# Patient Record
Sex: Female | Born: 1935 | Race: Black or African American | Hispanic: No | Marital: Married | Smoking: Never smoker
Health system: Southern US, Community
[De-identification: ages and names within clinical notes are randomized; demographics above are authoritative.]

## PROBLEM LIST (undated history)

## (undated) DIAGNOSIS — F0280 Dementia in other diseases classified elsewhere without behavioral disturbance: Secondary | ICD-10-CM

## (undated) DIAGNOSIS — E870 Hyperosmolality and hypernatremia: Secondary | ICD-10-CM

## (undated) DIAGNOSIS — E119 Type 2 diabetes mellitus without complications: Secondary | ICD-10-CM

## (undated) DIAGNOSIS — I10 Essential (primary) hypertension: Secondary | ICD-10-CM

## (undated) DIAGNOSIS — E559 Vitamin D deficiency, unspecified: Secondary | ICD-10-CM

## (undated) DIAGNOSIS — I639 Cerebral infarction, unspecified: Secondary | ICD-10-CM

## (undated) DIAGNOSIS — G309 Alzheimer's disease, unspecified: Secondary | ICD-10-CM

---

## 1999-12-04 ENCOUNTER — Emergency Department (HOSPITAL_COMMUNITY): Admission: EM | Admit: 1999-12-04 | Discharge: 1999-12-04 | Payer: Self-pay | Admitting: Emergency Medicine

## 2000-08-28 ENCOUNTER — Encounter: Payer: Self-pay | Admitting: Emergency Medicine

## 2000-08-28 ENCOUNTER — Emergency Department (HOSPITAL_COMMUNITY): Admission: EM | Admit: 2000-08-28 | Discharge: 2000-08-28 | Payer: Self-pay | Admitting: Emergency Medicine

## 2001-12-14 ENCOUNTER — Emergency Department (HOSPITAL_COMMUNITY): Admission: EM | Admit: 2001-12-14 | Discharge: 2001-12-14 | Payer: Self-pay | Admitting: Emergency Medicine

## 2001-12-14 ENCOUNTER — Encounter: Payer: Self-pay | Admitting: Emergency Medicine

## 2005-10-14 ENCOUNTER — Emergency Department (HOSPITAL_COMMUNITY): Admission: EM | Admit: 2005-10-14 | Discharge: 2005-10-14 | Payer: Self-pay | Admitting: Emergency Medicine

## 2007-10-17 ENCOUNTER — Encounter: Admission: RE | Admit: 2007-10-17 | Discharge: 2007-10-17 | Payer: Self-pay | Admitting: Neurology

## 2012-02-28 ENCOUNTER — Emergency Department (HOSPITAL_COMMUNITY)
Admission: EM | Admit: 2012-02-28 | Discharge: 2012-02-29 | Disposition: A | Discharge status: Home / Self Care | Payer: Medicare Other | Attending: Emergency Medicine | Admitting: Emergency Medicine

## 2012-02-28 DIAGNOSIS — R1110 Vomiting, unspecified: Secondary | ICD-10-CM

## 2012-02-28 DIAGNOSIS — D72829 Elevated white blood cell count, unspecified: Secondary | ICD-10-CM | POA: Insufficient documentation

## 2012-02-28 DIAGNOSIS — F0280 Dementia in other diseases classified elsewhere without behavioral disturbance: Secondary | ICD-10-CM | POA: Insufficient documentation

## 2012-02-28 DIAGNOSIS — G309 Alzheimer's disease, unspecified: Secondary | ICD-10-CM | POA: Insufficient documentation

## 2012-02-28 DIAGNOSIS — R112 Nausea with vomiting, unspecified: Secondary | ICD-10-CM | POA: Insufficient documentation

## 2012-02-28 DIAGNOSIS — R109 Unspecified abdominal pain: Secondary | ICD-10-CM | POA: Insufficient documentation

## 2012-02-28 LAB — CBC WITH DIFFERENTIAL/PLATELET
Basophils Relative: 0 % (ref 0–1)
Eosinophils Absolute: 0 10*3/uL (ref 0.0–0.7)
HCT: 36.9 % (ref 36.0–46.0)
Hemoglobin: 11.8 g/dL — ABNORMAL LOW (ref 12.0–15.0)
Lymphs Abs: 1.1 10*3/uL (ref 0.7–4.0)
MCH: 26.9 pg (ref 26.0–34.0)
MCHC: 32 g/dL (ref 30.0–36.0)
Monocytes Absolute: 0.5 10*3/uL (ref 0.1–1.0)
Monocytes Relative: 4 % (ref 3–12)
Neutrophils Relative %: 87 % — ABNORMAL HIGH (ref 43–77)
RBC: 4.38 MIL/uL (ref 3.87–5.11)

## 2012-02-28 LAB — COMPREHENSIVE METABOLIC PANEL
Albumin: 4.1 g/dL (ref 3.5–5.2)
Alkaline Phosphatase: 99 U/L (ref 39–117)
BUN: 17 mg/dL (ref 6–23)
Chloride: 102 mEq/L (ref 96–112)
Creatinine, Ser: 0.87 mg/dL (ref 0.50–1.10)
GFR calc Af Amer: 73 mL/min — ABNORMAL LOW (ref >90)
Glucose, Bld: 116 mg/dL — ABNORMAL HIGH (ref 70–99)
Potassium: 4.1 mEq/L (ref 3.5–5.1)
Total Bilirubin: 0.2 mg/dL — ABNORMAL LOW (ref 0.3–1.2)
Total Protein: 9 g/dL — ABNORMAL HIGH (ref 6.0–8.3)

## 2012-02-28 LAB — URINALYSIS, ROUTINE W REFLEX MICROSCOPIC
Bilirubin Urine: NEGATIVE
Leukocytes, UA: NEGATIVE
Nitrite: NEGATIVE
Urobilinogen, UA: 0.2 mg/dL (ref 0.0–1.0)

## 2012-02-28 LAB — LACTIC ACID, PLASMA: Lactic Acid, Venous: 2.1 mmol/L (ref 0.5–2.2)

## 2012-02-28 NOTE — ED Notes (Signed)
Pt is from [facility] and staff stated that pt started vomiting approximately one hour ago,  Pt vomited once en route and was given Zofran 4 mg IV through 20 gauge IV in LAC, pt is alert to person only and per report that is her normal

## 2012-02-28 NOTE — ED Provider Notes (Signed)
History     CSN: [id number]  Arrival date & time 02/28/12  2041   First MD Initiated Contact with Patient 02/28/12 2057      Chief Complaint  Patient presents with  . Nausea  . Emesis    (Consider location/radiation/quality/duration/timing/severity/associated sxs/prior treatment) HPI History provided by EMS.  Level 5 caveat applies because of dementia.  [facility] from [facility].  She started vomiting 1 hour prior to arrival in ED, and vomited once en route.  Was given 4mg  IV zofran and has not vomited since.  She is mentating at baseline; alert to person only.  Pt reports abdominal pain.  Denies CP.   No past medical history on file.  No past surgical history on file.  No family history on file.  History  Substance Use Topics  . Smoking status: Not on file  . Smokeless tobacco: Not on file  . Alcohol Use: Not on file    OB History    No data available      Review of Systems  All other systems reviewed and are negative.    Allergies  Review of patient's allergies indicates not on file.  Home Medications  No current outpatient prescriptions on file.  BP 165/73  Pulse 75  Temp 98.2 F (36.8 C) (Oral)  Resp 19  SpO2 100%  Physical Exam  Nursing note and vitals reviewed. Constitutional: She appears well-developed and well-nourished. No distress.  HENT:  Head: Normocephalic and atraumatic.  Mouth/Throat: Oropharynx is clear and moist.  Eyes:       Normal appearance  Neck: Normal range of motion.  Cardiovascular: Normal rate and regular rhythm.   Pulmonary/Chest: Effort normal and breath sounds normal. No respiratory distress.  Abdominal: Soft. Bowel sounds are normal. She exhibits no distension and no mass. There is no tenderness. There is no rebound and no guarding.  Genitourinary:       No CVA tenderness  Musculoskeletal: Normal range of motion.  Neurological: She is alert.  Skin: Skin is warm and dry. No rash noted.  Psychiatric: She has  a normal mood and affect. Her behavior is normal.    ED Course  Procedures (including critical care time)  Labs Reviewed  CBC WITH DIFFERENTIAL - Abnormal; Notable for the following:    WBC 12.2 (*)     Hemoglobin 11.8 (*)     RDW 16.0 (*)     Neutrophils Relative 87 (*)     Neutro Abs 10.5 (*)     Lymphocytes Relative 9 (*)     All other components within normal limits  COMPREHENSIVE METABOLIC PANEL - Abnormal; Notable for the following:    Glucose, Bld 116 (*)     Total Protein 9.0 (*)     Total Bilirubin 0.2 (*)     GFR calc non Af Amer 63 (*)     GFR calc Af Amer 73 (*)     All other components within normal limits  URINALYSIS, ROUTINE W REFLEX MICROSCOPIC - Abnormal; Notable for the following:    APPearance CLOUDY (*)     Specific Gravity, Urine 1.031 (*)     Ketones, ur TRACE (*)     Protein, ur 100 (*)     All other components within normal limits  CBC - Abnormal; Notable for the following:    WBC 16.3 (*)     Hemoglobin 11.6 (*)     HCT 35.9 (*)     RDW 16.0 (*)  All other components within normal limits  URINE MICROSCOPIC-ADD ON - Abnormal; Notable for the following:    Bacteria, UA MANY (*)     All other components within normal limits  LACTIC ACID, PLASMA  POCT I-STAT TROPONIN I  URINE CULTURE   Dg Chest 1 View  02/29/2012  *RADIOLOGY REPORT*  Clinical Data: Leukocytosis.  Dementia.  CHEST - 1 VIEW  Comparison: None.  Findings: The lungs are well-aerated.  Mild bibasilar opacities likely reflect atelectasis.  Pulmonary vascularity is at the upper limits of normal.  There is no evidence of pleural effusion or pneumothorax.  The cardiomediastinal silhouette is borderline normal in size.  No acute osseous abnormalities are seen.  IMPRESSION: Mild bibasilar opacities likely reflect atelectasis; lungs otherwise grossly clear.   Original Report Authenticated By: Tonia Ghent, M.D.      1. Vomiting       MDM  [phone] F w/ alzheimer's sent from SNF for  vomiting.  ED nursing staff spoke with nursing staff at [facility] and they reported  Blood-streaked emesis, though EMS says that there were only chunks of food in her vomit.  Pt has not vomited since receiving IV zofran en route.  She is afebrile, well-hydrated and abd benign on exam.  Labs unremarkable w/ exception of bacteruria.  Urine sent for culture.  Discussed w/ Dr. Rhunette Croft and he recommends repeating CBC in 4 hours to be sure that hemoglobin is stable.  11:35 PM  Hgb/Hct stable but WBC has increased.  Discussed w/ Dr. Rulon Abide who recommends CXR to r/o pneumonia, particularly because patient is unable to provide a history.  Pending.  Will recheck VS as well.  Pt continues to be alert and in NAD.  3:04 AM   CXR neg.  VSS and pt tolerating pos.  D/c'd back to SNF w/ prescription for zofran.  4:35 AM       Otilio Miu, PA-C 02/29/12 [phone]

## 2012-02-28 NOTE — ED Notes (Signed)
Bed:WA16<BR> Expected date:<BR> Expected time:<BR> Means of arrival:<BR> Comments:<BR>

## 2012-02-28 NOTE — ED Notes (Signed)
Per Florentina Addison PA we will do a recheck of CBC at 0130 then make decision on disposition

## 2012-02-29 ENCOUNTER — Emergency Department (HOSPITAL_COMMUNITY): Payer: Medicare Other

## 2012-02-29 LAB — CBC
HCT: 35.9 % — ABNORMAL LOW (ref 36.0–46.0)
MCH: 27.2 pg (ref 26.0–34.0)
MCHC: 32.3 g/dL (ref 30.0–36.0)
MCV: 84.3 fL (ref 78.0–100.0)
Platelets: 248 10*3/uL (ref 150–400)
RDW: 16 % — ABNORMAL HIGH (ref 11.5–15.5)

## 2012-02-29 MED ORDER — ONDANSETRON 8 MG PO TBDP
8.0000 mg | ORAL_TABLET | Freq: Once | ORAL | Status: AC
  Administered 2012-02-29: 8 mg via ORAL
  Filled 2012-02-29: qty 1

## 2012-02-29 MED ORDER — ONDANSETRON HCL 8 MG PO TABS: 8.0000 mg | ORAL_TABLET | Freq: Three times a day (TID) | ORAL | Status: DC | PRN

## 2012-02-29 MED ORDER — ONDANSETRON HCL 4 MG/2ML IJ SOLN: 4.0000 mg | Freq: Once | INTRAMUSCULAR | Status: DC

## 2012-02-29 NOTE — ED Provider Notes (Signed)
Medical screening examination/treatment/procedure(s) were performed by non-physician practitioner and as supervising physician I was immediately available for consultation/collaboration.  Derwood Kaplan, MD 02/29/12 1534

## 2015-06-26 ENCOUNTER — Emergency Department (HOSPITAL_COMMUNITY)
Admission: EM | Admit: 2015-06-26 | Discharge: 2015-06-26 | Disposition: A | Discharge status: Home / Self Care | Payer: Medicaid Other | Attending: Emergency Medicine | Admitting: Emergency Medicine

## 2015-06-26 ENCOUNTER — Encounter (HOSPITAL_COMMUNITY): Payer: Self-pay | Admitting: Emergency Medicine

## 2015-06-26 DIAGNOSIS — Y999 Unspecified external cause status: Secondary | ICD-10-CM | POA: Insufficient documentation

## 2015-06-26 DIAGNOSIS — Y939 Activity, unspecified: Secondary | ICD-10-CM | POA: Insufficient documentation

## 2015-06-26 DIAGNOSIS — E119 Type 2 diabetes mellitus without complications: Secondary | ICD-10-CM | POA: Diagnosis not present

## 2015-06-26 DIAGNOSIS — W19XXXA Unspecified fall, initial encounter: Secondary | ICD-10-CM | POA: Diagnosis not present

## 2015-06-26 DIAGNOSIS — Z8673 Personal history of transient ischemic attack (TIA), and cerebral infarction without residual deficits: Secondary | ICD-10-CM | POA: Diagnosis not present

## 2015-06-26 DIAGNOSIS — I10 Essential (primary) hypertension: Secondary | ICD-10-CM | POA: Insufficient documentation

## 2015-06-26 DIAGNOSIS — F0390 Unspecified dementia without behavioral disturbance: Secondary | ICD-10-CM | POA: Diagnosis present

## 2015-06-26 DIAGNOSIS — Y929 Unspecified place or not applicable: Secondary | ICD-10-CM | POA: Insufficient documentation

## 2015-06-26 DIAGNOSIS — Z7982 Long term (current) use of aspirin: Secondary | ICD-10-CM | POA: Insufficient documentation

## 2015-06-26 HISTORY — DX: Type 2 diabetes mellitus without complications: E11.9

## 2015-06-26 HISTORY — DX: Alzheimer's disease, unspecified: G30.9

## 2015-06-26 HISTORY — DX: Cerebral infarction, unspecified: I63.9

## 2015-06-26 HISTORY — DX: Dementia in other diseases classified elsewhere, unspecified severity, without behavioral disturbance, psychotic disturbance, mood disturbance, and anxiety: F02.80

## 2015-06-26 HISTORY — DX: Vitamin D deficiency, unspecified: E55.9

## 2015-06-26 HISTORY — DX: Essential (primary) hypertension: I10

## 2015-06-26 HISTORY — DX: Hyperosmolality and hypernatremia: E87.0

## 2015-06-26 NOTE — ED Notes (Signed)
[facility]

## 2015-06-26 NOTE — ED Notes (Signed)
[facility] EMS[facility] and then found in floor. Staff unsure of how patient fell. Hx of dementia. Not on blood thinners. Pt has new bruise to left cheek. Oriented to baseline. Pt has removed towel that EMS placed.

## 2015-06-26 NOTE — ED Notes (Signed)
[facility] Expected date:  Expected time:  Means of arrival:  Comments: 19F/fall

## 2015-06-26 NOTE — ED Provider Notes (Signed)
CSN: [id number]     Arrival date & time 06/26/15  1520 History   First MD Initiated Contact with Patient 06/26/15 1539     Chief Complaint  Patient presents with  . Fall    Level 5 caveat due to dementia. Patient is a 82 y.o. female presenting with fall. The history is provided by the nursing home and the EMS personnel.  Fall  Patient with fall. Patient is demented without complaints. Possible slight bruising to right face. Reportedly at baseline which is demented.    Past Medical History  Diagnosis Date  . Alzheimer's dementia   . Hypertension   . CVA (cerebral infarction)   . Diabetes mellitus without complication (HCC)   . Vitamin D deficiency   . Hyperosmolality   . Hypernatremia    No past surgical history on file. No family history on file. Social History  Substance Use Topics  . Smoking status: None  . Smokeless tobacco: None  . Alcohol Use: None   OB History    No data available     Review of Systems  Unable to perform ROS: Dementia      Allergies  Review of patient's allergies indicates no known allergies.  Home Medications   Prior to Admission medications   Medication Sig Start Date End Date Taking? Authorizing Provider  acetaminophen (TYLENOL) 500 MG tablet Take 500 mg by mouth every 6 (six) hours as needed for mild pain, fever or headache.   Yes Historical Provider, MD  acetaminophen (TYLENOL) 650 MG CR tablet Take 650 mg by mouth 3 (three) times daily as needed for pain.   Yes Historical Provider, MD  alum & mag hydroxide-simeth (MINTOX) 200-200-20 MG/5ML suspension Take 30 mLs by mouth every 6 (six) hours as needed for indigestion or heartburn.   Yes Historical Provider, MD  aspirin 81 MG chewable tablet Chew 81 mg by mouth every morning.   Yes Historical Provider, MD  Calcium Carb-Cholecalciferol (CALCIUM 500 +D) 500-400 MG-UNIT TABS Take 1 tablet by mouth daily.   Yes Historical Provider, MD  ferrous sulfate 220 (44 Fe) MG/5ML solution Take 330  mg by mouth at bedtime.   Yes Historical Provider, MD  guaifenesin (ROBITUSSIN) 100 MG/5ML syrup Take 200 mg by mouth every 6 (six) hours as needed for cough.   Yes Historical Provider, MD  hydrALAZINE (APRESOLINE) 50 MG tablet Take 50 mg by mouth 3 (three) times daily.   Yes Historical Provider, MD  loperamide (IMODIUM) 2 MG capsule Take 2 mg by mouth every 8 (eight) hours as needed for diarrhea or loose stools.   Yes Historical Provider, MD  losartan (COZAAR) 25 MG tablet Take 25 mg by mouth every morning.   Yes Historical Provider, MD  magnesium hydroxide (MILK OF MAGNESIA) 400 MG/5ML suspension Take 30 mLs by mouth daily as needed for mild constipation.   Yes Historical Provider, MD  mirtazapine (REMERON) 15 MG tablet Take 15 mg by mouth at bedtime.   Yes Historical Provider, MD  neomycin-bacitracin-polymyxin (NEOSPORIN) ointment Apply 1 application topically as needed for wound care. apply to eye   Yes Historical Provider, MD  ondansetron (ZOFRAN) 4 MG tablet Take 4 mg by mouth every 6 (six) hours as needed for nausea or vomiting.   Yes Historical Provider, MD   BP   Pulse 85  Temp(Src)   Resp 20  SpO2 98% Physical Exam  Constitutional: She appears well-developed.  HENT:  Head: Atraumatic.  No tenderness to face possible ecchymosis and right zygomatic  area.  Neck: Neck supple.  Cardiovascular: Normal rate.   Pulmonary/Chest: Effort normal.  Abdominal: Soft. There is no tenderness.  Musculoskeletal: Normal range of motion. She exhibits no edema.  Neurological: She is alert.  Pleasant but unable to participate in history. She is without complaints.  Skin: Skin is warm.    ED Course  Procedures (including critical care time) Labs Review Labs Reviewed - No data to display  Imaging Review No results found. I have personally reviewed and evaluated these images and lab results as part of my medical decision-making.   EKG Interpretation None      MDM   Final diagnoses:   Fall, initial encounter    Patient with fall. Possible bruising to face but nontender. Patient is at baseline. Discussed with patient's family. No evidence of injury. Will discharge home.    Benjiman Core, MD 06/26/15 [phone]

## 2015-07-11 ENCOUNTER — Encounter (HOSPITAL_COMMUNITY): Payer: Self-pay | Admitting: *Deleted

## 2015-07-11 ENCOUNTER — Emergency Department (HOSPITAL_COMMUNITY): Payer: Medicare Other

## 2015-07-11 ENCOUNTER — Emergency Department (HOSPITAL_COMMUNITY)
Admission: EM | Admit: 2015-07-11 | Discharge: 2015-07-11 | Disposition: A | Discharge status: Home / Self Care | Payer: Medicare Other | Attending: Emergency Medicine | Admitting: Emergency Medicine

## 2015-07-11 DIAGNOSIS — W07XXXA Fall from chair, initial encounter: Secondary | ICD-10-CM | POA: Diagnosis not present

## 2015-07-11 DIAGNOSIS — E119 Type 2 diabetes mellitus without complications: Secondary | ICD-10-CM | POA: Diagnosis not present

## 2015-07-11 DIAGNOSIS — F0280 Dementia in other diseases classified elsewhere without behavioral disturbance: Secondary | ICD-10-CM | POA: Diagnosis not present

## 2015-07-11 DIAGNOSIS — G309 Alzheimer's disease, unspecified: Secondary | ICD-10-CM | POA: Insufficient documentation

## 2015-07-11 DIAGNOSIS — Y939 Activity, unspecified: Secondary | ICD-10-CM | POA: Diagnosis not present

## 2015-07-11 DIAGNOSIS — Z7982 Long term (current) use of aspirin: Secondary | ICD-10-CM | POA: Diagnosis not present

## 2015-07-11 DIAGNOSIS — S0003XA Contusion of scalp, initial encounter: Secondary | ICD-10-CM | POA: Insufficient documentation

## 2015-07-11 DIAGNOSIS — Y999 Unspecified external cause status: Secondary | ICD-10-CM | POA: Insufficient documentation

## 2015-07-11 DIAGNOSIS — Z8673 Personal history of transient ischemic attack (TIA), and cerebral infarction without residual deficits: Secondary | ICD-10-CM | POA: Diagnosis not present

## 2015-07-11 DIAGNOSIS — S0990XA Unspecified injury of head, initial encounter: Secondary | ICD-10-CM | POA: Diagnosis present

## 2015-07-11 DIAGNOSIS — I10 Essential (primary) hypertension: Secondary | ICD-10-CM | POA: Diagnosis not present

## 2015-07-11 DIAGNOSIS — Y9289 Other specified places as the place of occurrence of the external cause: Secondary | ICD-10-CM | POA: Insufficient documentation

## 2015-07-11 NOTE — ED Notes (Signed)
Patient transported to CT 

## 2015-07-11 NOTE — ED Notes (Signed)
Patient was found by mini lab staff walking the department. Patient was redirected to her room and placed on a bed alarm. Yellow socks were placed on patient and RN notified.

## 2015-07-11 NOTE — ED Notes (Signed)
[facility]AR requested for patient pick up

## 2015-07-11 NOTE — ED Notes (Signed)
Bed: [id number] Expected date:  Expected time:  Means of arrival:  Comments: 84/fall

## 2015-07-11 NOTE — ED Notes (Signed)
[facility] EMS[facility] today, has hx of alzheimer's.  Was sitting in a chair in the activity room, fell asleep and fell face forward.  Hematoma to forehead noted per EMS.  CBG - 122.  Pt is alert per norm.

## 2015-07-11 NOTE — Discharge Instructions (Signed)

## 2015-07-11 NOTE — ED Provider Notes (Signed)
CSN: [id number]     Arrival date & time 07/11/15  1429 History   First MD Initiated Contact with Patient 07/11/15 1439     Chief Complaint  Patient presents with  . Fall    HPI Pt has a history of dementia and is unable to provide any history.  She will answer simple questions with yes and no.  Pt denies any complaints.   She resides at a nursing facility.  She was sleeping in a chair when she fell forward striking her forehead on the ground.   She sustained a large bump on the forehead and was sent to the ED. Past Medical History  Diagnosis Date  . Alzheimer's dementia   . Hypertension   . CVA (cerebral infarction)   . Diabetes mellitus without complication (HCC)   . Vitamin D deficiency   . Hyperosmolality   . Hypernatremia    History reviewed. No pertinent past surgical history. No family history on file. Social History  Substance Use Topics  . Smoking status: Never Smoker   . Smokeless tobacco: None  . Alcohol Use: No   OB History    No data available     Review of Systems    Allergies  Review of patient's allergies indicates no known allergies.  Home Medications   Prior to Admission medications   Medication Sig Start Date End Date Taking? Authorizing Provider  acetaminophen (TYLENOL) 500 MG tablet Take 500 mg by mouth every 6 (six) hours as needed for mild pain, fever or headache.   Yes Historical Provider, MD  acetaminophen (TYLENOL) 650 MG CR tablet Take 650 mg by mouth 3 (three) times daily as needed for pain.   Yes Historical Provider, MD  alum & mag hydroxide-simeth (MINTOX) 200-200-20 MG/5ML suspension Take 30 mLs by mouth every 6 (six) hours as needed for indigestion or heartburn.   Yes Historical Provider, MD  aspirin 81 MG chewable tablet Chew 81 mg by mouth every morning.   Yes Historical Provider, MD  Calcium Carb-Cholecalciferol (CALCIUM 500 +D) 500-400 MG-UNIT TABS Take 1 tablet by mouth daily.   Yes Historical Provider, MD  ferrous sulfate 220 (44 Fe)  MG/5ML solution Take 330 mg by mouth at bedtime.   Yes Historical Provider, MD  guaifenesin (ROBITUSSIN) 100 MG/5ML syrup Take 200 mg by mouth every 6 (six) hours as needed for cough.   Yes Historical Provider, MD  hydrALAZINE (APRESOLINE) 50 MG tablet Take 50 mg by mouth 3 (three) times daily.   Yes Historical Provider, MD  loperamide (IMODIUM) 2 MG capsule Take 2 mg by mouth as needed for diarrhea or loose stools.    Yes Historical Provider, MD  losartan (COZAAR) 25 MG tablet Take 25 mg by mouth every morning.   Yes Historical Provider, MD  magnesium hydroxide (MILK OF MAGNESIA) 400 MG/5ML suspension Take 30 mLs by mouth daily as needed for mild constipation.   Yes Historical Provider, MD  mirtazapine (REMERON) 15 MG tablet Take 15 mg by mouth at bedtime.   Yes Historical Provider, MD  neomycin-bacitracin-polymyxin (NEOSPORIN) ointment Apply 1 application topically as needed for wound care.    Yes Historical Provider, MD  ondansetron (ZOFRAN) 4 MG tablet Take 4 mg by mouth every 6 (six) hours as needed for nausea or vomiting.   Yes Historical Provider, MD   BP 149/68 mmHg  Pulse 61  Temp(Src) 97.8 F (36.6 C) (Oral)  SpO2 100% Physical Exam  Constitutional: No distress.  HENT:  Head: Normocephalic.  Right  Ear: External ear normal.  Left Ear: External ear normal.  Hematoma right forehead, mild ttp  Eyes: Conjunctivae are normal. Right eye exhibits no discharge. Left eye exhibits no discharge. No scleral icterus.  Neck: Neck supple. No tracheal deviation present.  Cardiovascular: Normal rate, regular rhythm and normal heart sounds.   Pulmonary/Chest: Effort normal. No stridor. No respiratory distress. She has no wheezes. She has no rales.  Musculoskeletal: She exhibits no edema.       Right shoulder: Normal.       Left shoulder: Normal.       Right hip: Normal.       Left hip: Normal.       Cervical back: Normal.       Thoracic back: Normal.       Lumbar back: Normal.  Neurological:  She is alert. Cranial nerve deficit: no gross deficits.  Alert and active, taking off her bp cuff and o2 sat monitor.  Folding up items on the bed  Skin: Skin is warm and dry. No rash noted. She is not diaphoretic.  Psychiatric: She has a normal mood and affect.  Nursing note and vitals reviewed.   ED Course  Procedures (including critical care time) Labs Review Labs Reviewed - No data to display  Imaging Review Ct Head Wo Contrast  07/11/2015  CLINICAL DATA:  Fall at nursing home today, was sitting in a chair in the activity room, fell asleep and fell face forward, RIGHT frontal hematoma, history Alzheimer's, hypertension, stroke, diabetes mellitus EXAM: CT HEAD WITHOUT CONTRAST TECHNIQUE: Contiguous axial images were obtained from the base of the skull through the vertex without intravenous contrast. COMPARISON:  None FINDINGS: Scattered streak artifact from motion. Diffuse dilatation of the ventricular system question normal pressure hydrocephalus. Low-attenuation of deep cerebral and periventricular white matter question due to small vessel chronic ischemic changes or transependymal CSF flow. No intracranial hemorrhage, mass lesion, evidence acute infarction, or extra-axial fluid collections. RIGHT frontal scalp hematoma. Calvaria intact. Mucosal thickening of ethmoid air cells and LEFT maxillary sinus. Remaining sinuses and mastoid air cells clear. IMPRESSION: Diffuse ventricular dilatation question normal pressure hydrocephalus. Small vessel chronic ischemic changes of deep cerebral white matter versus transependymal CSF flow. No acute intracranial abnormalities otherwise identified. RIGHT frontal scalp hematoma. Electronically Signed   By: Ulyses Southward M.D.   On: 07/11/2015 15:51   I have personally reviewed and evaluated these images and lab results as part of my medical decision-making.    MDM   Final diagnoses:  Scalp hematoma, initial encounter    Pt has a focal hematoma on her  forehead.  Otherwise appears well.  CT scan without significant injury.  Stable to return to the living facility.    Linwood Dibbles, MD 07/11/15 [phone]

## 2015-07-26 ENCOUNTER — Emergency Department (HOSPITAL_COMMUNITY)
Admission: EM | Admit: 2015-07-26 | Discharge: 2015-07-26 | Disposition: A | Discharge status: Home / Self Care | Payer: Medicare Other | Attending: Emergency Medicine | Admitting: Emergency Medicine

## 2015-07-26 ENCOUNTER — Encounter (HOSPITAL_COMMUNITY): Payer: Self-pay | Admitting: Emergency Medicine

## 2015-07-26 DIAGNOSIS — Y929 Unspecified place or not applicable: Secondary | ICD-10-CM | POA: Diagnosis not present

## 2015-07-26 DIAGNOSIS — W07XXXA Fall from chair, initial encounter: Secondary | ICD-10-CM | POA: Diagnosis not present

## 2015-07-26 DIAGNOSIS — I10 Essential (primary) hypertension: Secondary | ICD-10-CM | POA: Diagnosis not present

## 2015-07-26 DIAGNOSIS — Z043 Encounter for examination and observation following other accident: Secondary | ICD-10-CM | POA: Diagnosis not present

## 2015-07-26 DIAGNOSIS — Z7982 Long term (current) use of aspirin: Secondary | ICD-10-CM | POA: Diagnosis not present

## 2015-07-26 DIAGNOSIS — Y939 Activity, unspecified: Secondary | ICD-10-CM | POA: Diagnosis not present

## 2015-07-26 DIAGNOSIS — Z7984 Long term (current) use of oral hypoglycemic drugs: Secondary | ICD-10-CM | POA: Insufficient documentation

## 2015-07-26 DIAGNOSIS — Z79899 Other long term (current) drug therapy: Secondary | ICD-10-CM | POA: Insufficient documentation

## 2015-07-26 DIAGNOSIS — W19XXXA Unspecified fall, initial encounter: Secondary | ICD-10-CM

## 2015-07-26 DIAGNOSIS — Z8673 Personal history of transient ischemic attack (TIA), and cerebral infarction without residual deficits: Secondary | ICD-10-CM | POA: Insufficient documentation

## 2015-07-26 DIAGNOSIS — Y999 Unspecified external cause status: Secondary | ICD-10-CM | POA: Diagnosis not present

## 2015-07-26 DIAGNOSIS — E119 Type 2 diabetes mellitus without complications: Secondary | ICD-10-CM | POA: Diagnosis not present

## 2015-07-26 NOTE — ED Notes (Signed)
Bed: [facility] Expected date:  Expected time:  Means of arrival:  Comments: 

## 2015-07-26 NOTE — ED Notes (Signed)
Daughter @ [facility].   She stated "[facility] [address].  When she goes to sleep she leans over and falls out of the chair.  They have to send her to the hospital each time.  I don't really see anything different except for some dried blood on her nose.  Pt presents with dried blood to right nare.

## 2015-07-26 NOTE — ED Notes (Signed)
Per EMS-unwitnessed fall-fell forward out of chair-possibly hit head-no LOC-no complaints of pain-patient at baseline mentally

## 2015-07-26 NOTE — ED Provider Notes (Signed)
CSN: [id number]     Arrival date & time 07/26/15  1707 History   First MD Initiated Contact with Patient 07/26/15 1759     Chief Complaint  Patient presents with  . Fall     (Consider location/radiation/quality/duration/timing/severity/associated sxs/prior Treatment) Patient is a 82 y.o. female presenting with fall.  Fall This is a new problem. The current episode started 1 to 2 hours ago. The problem has not changed since onset.Pertinent negatives include no chest pain, no abdominal pain, no headaches and no shortness of breath. Nothing aggravates the symptoms. Nothing relieves the symptoms.    Past Medical History  Diagnosis Date  . Alzheimer's dementia   . Hypertension   . CVA (cerebral infarction)   . Diabetes mellitus without complication (HCC)   . Vitamin D deficiency   . Hyperosmolality   . Hypernatremia    History reviewed. No pertinent past surgical history. No family history on file. Social History  Substance Use Topics  . Smoking status: Never Smoker   . Smokeless tobacco: None  . Alcohol Use: No   OB History    No data available     Review of Systems  Constitutional: Negative for fever.  HENT: Negative for congestion and facial swelling.   Eyes: Negative for discharge and redness.  Respiratory: Negative for cough and shortness of breath.   Cardiovascular: Negative for chest pain.  Gastrointestinal: Negative for abdominal pain and abdominal distention.  Endocrine: Negative for polydipsia.  Genitourinary: Negative for dysuria.  Musculoskeletal: Negative for back pain.  Skin: Negative for wound.  Neurological: Negative for headaches.      Allergies  Review of patient's allergies indicates no known allergies.  Home Medications   Prior to Admission medications   Medication Sig Start Date End Date Taking? Authorizing Provider  acetaminophen (TYLENOL) 500 MG tablet Take 500 mg by mouth every 6 (six) hours as needed for mild pain, fever or headache.    Yes Historical Provider, MD  acetaminophen (TYLENOL) 650 MG CR tablet Take 650 mg by mouth 3 (three) times daily as needed for pain.   Yes Historical Provider, MD  alum & mag hydroxide-simeth (MINTOX) 200-200-20 MG/5ML suspension Take 30 mLs by mouth every 6 (six) hours as needed for indigestion or heartburn.   Yes Historical Provider, MD  aspirin 81 MG chewable tablet Chew 81 mg by mouth every morning.   Yes Historical Provider, MD  Calcium Carb-Cholecalciferol (CALCIUM 500 +D) 500-400 MG-UNIT TABS Take 1 tablet by mouth daily.   Yes Historical Provider, MD  ferrous sulfate 220 (44 Fe) MG/5ML solution Take 330 mg by mouth at bedtime.   Yes Historical Provider, MD  guaifenesin (ROBITUSSIN) 100 MG/5ML syrup Take 200 mg by mouth every 6 (six) hours as needed for cough.   Yes Historical Provider, MD  hydrALAZINE (APRESOLINE) 50 MG tablet Take 50 mg by mouth 3 (three) times daily.   Yes Historical Provider, MD  loperamide (IMODIUM) 2 MG capsule Take 2 mg by mouth as needed for diarrhea or loose stools.    Yes Historical Provider, MD  losartan (COZAAR) 25 MG tablet Take 25 mg by mouth every morning.   Yes Historical Provider, MD  magnesium hydroxide (MILK OF MAGNESIA) 400 MG/5ML suspension Take 30 mLs by mouth daily as needed for mild constipation.   Yes Historical Provider, MD  mirtazapine (REMERON) 15 MG tablet Take 15 mg by mouth at bedtime.   Yes Historical Provider, MD  neomycin-bacitracin-polymyxin (NEOSPORIN) ointment Apply 1 application topically as needed for  wound care.    Yes Historical Provider, MD  ondansetron (ZOFRAN) 4 MG tablet Take 4 mg by mouth every 6 (six) hours as needed for nausea or vomiting.   Yes Historical Provider, MD   BP 125/87 mmHg  Pulse 96  Temp(Src) 98 F (36.7 C) (Oral)  Resp 20  SpO2 100% Physical Exam  Constitutional: She appears well-developed and well-nourished.  HENT:  Head: Normocephalic and atraumatic.  Neck: Normal range of motion.  Cardiovascular:  Normal rate and regular rhythm.   Pulmonary/Chest: No stridor. No respiratory distress.  Abdominal: She exhibits no distension.  Musculoskeletal:  No cervical spine tenderness, thoracic spine tenderness or Lumbar spine tenderness.  No tenderness or pain with palpation and full ROM of all joints in upper and lower extremities.  No ecchymosis or other signs of trauma on back or extremities.  No Pain with AP or lateral compression of ribs.  NO Paracervical ttp, paraspinal ttp   Neurological: She is alert.  Nursing note and vitals reviewed.   ED Course  Procedures (including critical care time) Labs Review Labs Reviewed - No data to display  Imaging Review No results found. I have personally reviewed and evaluated these images and lab results as part of my medical decision-making.   EKG Interpretation None      MDM   Final diagnoses:  None   Unwitnessed fall without any evidence of injury. No evidence of trauma patient is at her baseline no indication for CT scan this time. Plan for discharge with her daughter back to the facility and observation for last couple hours to ensure no change in mental status. New Prescriptions: New Prescriptions   No medications on file     I have personally and contemperaneously reviewed labs and imaging and used in my decision making as above.   A medical screening exam was performed and I feel the patient has had an appropriate workup for their chief complaint at this time and likelihood of emergent condition existing is low and thus workup can continue on an outpatient basis.. Their vital signs are stable. They have been counseled on decision, discharge, follow up and which symptoms necessitate immediate return to the emergency department.  They verbally stated understanding and agreement with plan and discharged in stable condition.    Marily Memos, MD 07/26/15 [phone]

## 2015-08-25 ENCOUNTER — Emergency Department (HOSPITAL_COMMUNITY): Payer: Medicare Other

## 2015-08-25 ENCOUNTER — Encounter (HOSPITAL_COMMUNITY): Payer: Self-pay | Admitting: Emergency Medicine

## 2015-08-25 ENCOUNTER — Emergency Department (HOSPITAL_COMMUNITY)
Admission: EM | Admit: 2015-08-25 | Discharge: 2015-08-25 | Disposition: A | Discharge status: Home / Self Care | Payer: Medicare Other | Attending: Emergency Medicine | Admitting: Emergency Medicine

## 2015-08-25 DIAGNOSIS — I10 Essential (primary) hypertension: Secondary | ICD-10-CM | POA: Insufficient documentation

## 2015-08-25 DIAGNOSIS — Z7982 Long term (current) use of aspirin: Secondary | ICD-10-CM | POA: Insufficient documentation

## 2015-08-25 DIAGNOSIS — Z79899 Other long term (current) drug therapy: Secondary | ICD-10-CM | POA: Insufficient documentation

## 2015-08-25 DIAGNOSIS — G309 Alzheimer's disease, unspecified: Secondary | ICD-10-CM | POA: Diagnosis not present

## 2015-08-25 DIAGNOSIS — R112 Nausea with vomiting, unspecified: Secondary | ICD-10-CM | POA: Diagnosis not present

## 2015-08-25 DIAGNOSIS — Z8673 Personal history of transient ischemic attack (TIA), and cerebral infarction without residual deficits: Secondary | ICD-10-CM | POA: Diagnosis not present

## 2015-08-25 LAB — COMPREHENSIVE METABOLIC PANEL
ALK PHOS: 65 U/L (ref 38–126)
ALT: 22 U/L (ref 14–54)
AST: 25 U/L (ref 15–41)
Albumin: 4.1 g/dL (ref 3.5–5.0)
Anion gap: 7 (ref 5–15)
BILIRUBIN TOTAL: 0.5 mg/dL (ref 0.3–1.2)
BUN: 24 mg/dL — ABNORMAL HIGH (ref 6–20)
CALCIUM: 9.3 mg/dL (ref 8.9–10.3)
CO2: 30 mmol/L (ref 22–32)
CREATININE: 1.13 mg/dL — AB (ref 0.44–1.00)
Chloride: 106 mmol/L (ref 101–111)
GFR, EST AFRICAN AMERICAN: 52 mL/min — AB (ref >60)
GFR, EST NON AFRICAN AMERICAN: 45 mL/min — AB (ref >60)
Glucose, Bld: 106 mg/dL — ABNORMAL HIGH (ref 65–99)
Potassium: 3.4 mmol/L — ABNORMAL LOW (ref 3.5–5.1)
Sodium: 143 mmol/L (ref 135–145)
Total Protein: 7.7 g/dL (ref 6.5–8.1)

## 2015-08-25 LAB — CBC WITH DIFFERENTIAL/PLATELET
BASOS ABS: 0 10*3/uL (ref 0.0–0.1)
BASOS PCT: 0 %
EOS ABS: 0.2 10*3/uL (ref 0.0–0.7)
Eosinophils Relative: 3 %
HEMATOCRIT: 31.6 % — AB (ref 36.0–46.0)
HEMOGLOBIN: 10 g/dL — AB (ref 12.0–15.0)
Lymphocytes Relative: 22 %
Lymphs Abs: 1 10*3/uL (ref 0.7–4.0)
MCH: 27.2 pg (ref 26.0–34.0)
MCHC: 31.6 g/dL (ref 30.0–36.0)
MCV: 86.1 fL (ref 78.0–100.0)
MONOS PCT: 6 %
Monocytes Absolute: 0.3 10*3/uL (ref 0.1–1.0)
NEUTROS ABS: 3.2 10*3/uL (ref 1.7–7.7)
NEUTROS PCT: 69 %
Platelets: 306 10*3/uL (ref 150–400)
RBC: 3.67 MIL/uL — AB (ref 3.87–5.11)
RDW: 15.8 % — ABNORMAL HIGH (ref 11.5–15.5)
WBC: 4.6 10*3/uL (ref 4.0–10.5)

## 2015-08-25 LAB — URINALYSIS, ROUTINE W REFLEX MICROSCOPIC
BILIRUBIN URINE: NEGATIVE
Glucose, UA: NEGATIVE mg/dL
HGB URINE DIPSTICK: NEGATIVE
Ketones, ur: NEGATIVE mg/dL
Leukocytes, UA: NEGATIVE
Nitrite: NEGATIVE
Protein, ur: NEGATIVE mg/dL
SPECIFIC GRAVITY, URINE: 1.015 (ref 1.005–1.030)
pH: 6 (ref 5.0–8.0)

## 2015-08-25 LAB — I-STAT TROPONIN, ED: TROPONIN I, POC: 0 ng/mL (ref 0.00–0.08)

## 2015-08-25 LAB — LIPASE, BLOOD: LIPASE: 31 U/L (ref 11–51)

## 2015-08-25 MED ORDER — LORAZEPAM 2 MG/ML IJ SOLN
0.5000 mg | Freq: Once | INTRAMUSCULAR | Status: AC
  Administered 2015-08-25: 0.5 mg via INTRAVENOUS
  Filled 2015-08-25: qty 1

## 2015-08-25 MED ORDER — ONDANSETRON HCL 4 MG/2ML IJ SOLN
4.0000 mg | Freq: Once | INTRAMUSCULAR | Status: AC
  Administered 2015-08-25: 4 mg via INTRAVENOUS
  Filled 2015-08-25: qty 2

## 2015-08-25 MED ORDER — SODIUM CHLORIDE 0.9 % IV BOLUS (SEPSIS)
1000.0000 mL | Freq: Once | INTRAVENOUS | Status: AC
  Administered 2015-08-25: 1000 mL via INTRAVENOUS

## 2015-08-25 NOTE — ED Notes (Signed)
Attempted to offer patient food-patient sleeping.  MD made aware.

## 2015-08-25 NOTE — ED Provider Notes (Signed)
CSN: [id number]     Arrival date & time 08/25/15  0946 History   First MD Initiated Contact with Patient 08/25/15 [phone]     Chief Complaint  Patient presents with  . Emesis     (Consider location/radiation/quality/duration/timing/severity/associated sxs/prior Treatment) Level V caveat due to dementia HPI 82 year old female with history of Alzheimer's dementia, prior CVA, hypertension and diabetes who presents with vomiting. Presents from [facility] skilled nursing facility. Nursing staff reports that patient had vomiting starting at 9 AM this morning. Patient at baseline is nonverbal, and they state that she is currently at her mental status baseline. She currently denies any pain. Otherwise is not able to provide any history.  Past Medical History  Diagnosis Date  . Alzheimer's dementia   . Hypertension   . CVA (cerebral infarction)   . Diabetes mellitus without complication (HCC)   . Vitamin D deficiency   . Hyperosmolality   . Hypernatremia    History reviewed. No pertinent past surgical history. History reviewed. No pertinent family history. Social History  Substance Use Topics  . Smoking status: Never Smoker   . Smokeless tobacco: None  . Alcohol Use: No   OB History    No data available     Review of Systems  Unable to perform ROS: Dementia      Allergies  Review of patient's allergies indicates no known allergies.  Home Medications   Prior to Admission medications   Medication Sig Start Date End Date Taking? Authorizing Provider  acetaminophen (TYLENOL) 500 MG tablet Take 500 mg by mouth every 6 (six) hours as needed for mild pain, fever or headache.    Historical Provider, MD  acetaminophen (TYLENOL) 650 MG CR tablet Take 650 mg by mouth 3 (three) times daily as needed for pain.    Historical Provider, MD  alum & mag hydroxide-simeth (MINTOX) 200-200-20 MG/5ML suspension Take 30 mLs by mouth every 6 (six) hours as needed for indigestion or heartburn.     Historical Provider, MD  aspirin 81 MG chewable tablet Chew 81 mg by mouth every morning.    Historical Provider, MD  Calcium Carb-Cholecalciferol (CALCIUM 500 +D) 500-400 MG-UNIT TABS Take 1 tablet by mouth daily.    Historical Provider, MD  ferrous sulfate 220 (44 Fe) MG/5ML solution Take 330 mg by mouth at bedtime.    Historical Provider, MD  guaifenesin (ROBITUSSIN) 100 MG/5ML syrup Take 200 mg by mouth every 6 (six) hours as needed for cough.    Historical Provider, MD  hydrALAZINE (APRESOLINE) 50 MG tablet Take 50 mg by mouth 3 (three) times daily.    Historical Provider, MD  loperamide (IMODIUM) 2 MG capsule Take 2 mg by mouth as needed for diarrhea or loose stools.     Historical Provider, MD  losartan (COZAAR) 25 MG tablet Take 25 mg by mouth every morning.    Historical Provider, MD  magnesium hydroxide (MILK OF MAGNESIA) 400 MG/5ML suspension Take 30 mLs by mouth daily as needed for mild constipation.    Historical Provider, MD  mirtazapine (REMERON) 15 MG tablet Take 15 mg by mouth at bedtime.    Historical Provider, MD  neomycin-bacitracin-polymyxin (NEOSPORIN) ointment Apply 1 application topically as needed for wound care.     Historical Provider, MD  ondansetron (ZOFRAN) 4 MG tablet Take 4 mg by mouth every 6 (six) hours as needed for nausea or vomiting.    Historical Provider, MD   BP 155/91 mmHg  Pulse 80  Temp(Src) 97.5 F (36.4  C) (Axillary)  Resp 13  SpO2 97% Physical Exam Physical Exam  Nursing note and vitals reviewed. Constitutional: elderly woman, non-toxic, and in no acute distress Head: Normocephalic and atraumatic.  Mouth/Throat: Mucous membranes moist Neck: Normal range of motion. Neck supple.  Cardiovascular: Normal rate and regular rhythm.   Pulmonary/Chest: Effort normal and breath sounds normal.  Abdominal: Soft. There is no tenderness. There is no rebound and no guarding.  Musculoskeletal: Normal range of motion.  Neurological: Alert, no facial droop,  non-verbal, moves all extremities symmetrically, does not obey commands Skin: Skin is warm and dry.  Psychiatric: Cooperative  ED Course  Procedures (including critical care time) Labs Review Labs Reviewed - No data to display  Imaging Review No results found. I have personally reviewed and evaluated these images and lab results as part of my medical decision-making.   EKG Interpretation None      MDM   Final diagnoses:  None    82 year old female with history of dementia, prior CVA, hypertension who presents with nausea and vomiting prior to arrival. She is at her reported baseline mental status. Grossly neuro intact. Was soft and benign abdomen. Seems like patient was vomiting phlegm prior to arrival, but she is no further emesis here in the emergency department. Given recent falls CT head performed to look for intracranial bleeding, which is visualized. X-ray acute abdominal series without obstructive pattern. With her benign abdominal exam and she does not require any further imaging. Basic blood work overall unremarkable aside from mild AKI which she is given IV fluids. EKG non-ischemic and tropinin negative. Seems less likely to be aytpical ACS presentation. She is observed in the ED and able to tolerate by mouth intake. She continues to be at her baseline. I feel that she is stabilized for discharge back to her facility.    Lavera Guiseana Duo Malley Hauter, MD 08/25/15 1556

## 2015-08-25 NOTE — Discharge Instructions (Signed)
Return for worsening symptoms, including fever, worsening pain, intractable vomiting or any other symptoms concerning to you.  Nausea and Vomiting Nausea is a sick feeling that often comes before throwing up (vomiting). Vomiting is a reflex where stomach contents come out of your mouth. Vomiting can cause severe loss of body fluids (dehydration). Children and elderly adults can become dehydrated quickly, especially if they also have diarrhea. Nausea and vomiting are symptoms of a condition or disease. It is important to find the cause of your symptoms. CAUSES   Direct irritation of the stomach lining. This irritation can result from increased acid production (gastroesophageal reflux disease), infection, food poisoning, taking certain medicines (such as nonsteroidal anti-inflammatory drugs), alcohol use, or tobacco use.  Signals from the brain.These signals could be caused by a headache, heat exposure, an inner ear disturbance, increased pressure in the brain from injury, infection, a tumor, or a concussion, pain, emotional stimulus, or metabolic problems.  An obstruction in the gastrointestinal tract (bowel obstruction).  Illnesses such as diabetes, hepatitis, gallbladder problems, appendicitis, kidney problems, cancer, sepsis, atypical symptoms of a heart attack, or eating disorders.  Medical treatments such as chemotherapy and radiation.  Receiving medicine that makes you sleep (general anesthetic) during surgery. DIAGNOSIS Your caregiver may ask for tests to be done if the problems do not improve after a few days. Tests may also be done if symptoms are severe or if the reason for the nausea and vomiting is not clear. Tests may include:  Urine tests.  Blood tests.  Stool tests.  Cultures (to look for evidence of infection).  X-rays or other imaging studies. Test results can help your caregiver make decisions about treatment or the need for additional tests. TREATMENT You need to  stay well hydrated. Drink frequently but in small amounts.You may wish to drink water, sports drinks, clear broth, or eat frozen ice pops or gelatin dessert to help stay hydrated.When you eat, eating slowly may help prevent nausea.There are also some antinausea medicines that may help prevent nausea. HOME CARE INSTRUCTIONS   Take all medicine as directed by your caregiver.  If you do not have an appetite, do not force yourself to eat. However, you must continue to drink fluids.  If you have an appetite, eat a normal diet unless your caregiver tells you differently.  Eat a variety of complex carbohydrates (rice, wheat, potatoes, bread), lean meats, yogurt, fruits, and vegetables.  Avoid high-fat foods because they are more difficult to digest.  Drink enough water and fluids to keep your urine clear or pale yellow.  If you are dehydrated, ask your caregiver for specific rehydration instructions. Signs of dehydration may include:  Severe thirst.  Dry lips and mouth.  Dizziness.  Dark urine.  Decreasing urine frequency and amount.  Confusion.  Rapid breathing or pulse. SEEK IMMEDIATE MEDICAL CARE IF:   You have blood or brown flecks (like coffee grounds) in your vomit.  You have black or bloody stools.  You have a severe headache or stiff neck.  You are confused.  You have severe abdominal pain.  You have chest pain or trouble breathing.  You do not urinate at least once every 8 hours.  You develop cold or clammy skin.  You continue to vomit for longer than 24 to 48 hours.  You have a fever. MAKE SURE YOU:   Understand these instructions.  Will watch your condition.  Will get help right away if you are not doing well or get worse.  This information is not intended to replace advice given to you by your health care provider. Make sure you discuss any questions you have with your health care provider.   Document Released: 01/21/2005 Document Revised:  04/15/2011 Document Reviewed: 06/20/2010 Elsevier Interactive Patient Education [employer]2016 Elsevier Inc.

## 2015-08-25 NOTE — ED Notes (Signed)
[facility] EMS [facility].  Per staff at facility patient "vomiting a lot phlegm since 0900 this AM".  Staff denies N/V/D, fever.  Patient has history of alzheimers and is currently at baseline and normally nonverbal.

## 2015-08-25 NOTE — ED Notes (Signed)
Bed: [id number] Expected date:  Expected time:  Means of arrival:  Comments: 82 yo emesis

## 2015-08-25 NOTE — ED Notes (Signed)
[facility] called for transport back to facility-states 1.5 hours

## 2015-08-25 NOTE — ED Notes (Signed)
Patient now sitting in bed awake, drinking PO fluids without difficulty.

## 2015-08-25 NOTE — ED Notes (Signed)
MD at bedside. 

## 2015-08-25 NOTE — ED Notes (Addendum)
Per MD continue to monitor patient prior to disposition.  Patient remains asleep but will respond to voice.

## 2015-08-25 NOTE — ED Notes (Signed)
Attempted to call to see how long transportation back to facility would take-states they are unsure at present due to after hours taking call now but that patient is on the list for transportation [facility].

## 2015-08-25 NOTE — ED Notes (Signed)
Bed alarm on due to PT would not stay in bed

## 2015-08-30 ENCOUNTER — Encounter (HOSPITAL_COMMUNITY): Payer: Self-pay | Admitting: Emergency Medicine

## 2015-08-30 ENCOUNTER — Emergency Department (HOSPITAL_COMMUNITY): Payer: Medicare Other

## 2015-08-30 ENCOUNTER — Emergency Department (HOSPITAL_COMMUNITY)
Admission: EM | Admit: 2015-08-30 | Discharge: 2015-08-30 | Disposition: A | Discharge status: Home / Self Care | Payer: Medicare Other | Attending: Emergency Medicine | Admitting: Emergency Medicine

## 2015-08-30 DIAGNOSIS — G309 Alzheimer's disease, unspecified: Secondary | ICD-10-CM | POA: Diagnosis not present

## 2015-08-30 DIAGNOSIS — Z048 Encounter for examination and observation for other specified reasons: Secondary | ICD-10-CM | POA: Diagnosis present

## 2015-08-30 DIAGNOSIS — Y999 Unspecified external cause status: Secondary | ICD-10-CM | POA: Diagnosis not present

## 2015-08-30 DIAGNOSIS — I10 Essential (primary) hypertension: Secondary | ICD-10-CM | POA: Diagnosis not present

## 2015-08-30 DIAGNOSIS — Y939 Activity, unspecified: Secondary | ICD-10-CM | POA: Diagnosis not present

## 2015-08-30 DIAGNOSIS — Y929 Unspecified place or not applicable: Secondary | ICD-10-CM | POA: Diagnosis not present

## 2015-08-30 DIAGNOSIS — W010XXA Fall on same level from slipping, tripping and stumbling without subsequent striking against object, initial encounter: Secondary | ICD-10-CM | POA: Insufficient documentation

## 2015-08-30 DIAGNOSIS — E119 Type 2 diabetes mellitus without complications: Secondary | ICD-10-CM | POA: Insufficient documentation

## 2015-08-30 DIAGNOSIS — Z8673 Personal history of transient ischemic attack (TIA), and cerebral infarction without residual deficits: Secondary | ICD-10-CM | POA: Diagnosis not present

## 2015-08-30 DIAGNOSIS — W19XXXA Unspecified fall, initial encounter: Secondary | ICD-10-CM

## 2015-08-30 NOTE — ED Triage Notes (Signed)
Per EMS pt from [facility] for witnessed trip over walker and fall from standing onto carpet, no head injury, no LOC. Pt at mental baseline with dementia, unable to provide information or describe pain. No anticoagulants

## 2015-08-30 NOTE — ED Notes (Signed)
Pt ambulated in hall with little assist. 

## 2015-08-30 NOTE — ED Notes (Signed)
[facility] called for patient 

## 2015-08-30 NOTE — ED Notes (Signed)
Meal tray ordered for patient as well as an activity belt. She continues to try to get up and threads her legs through the bed rails.

## 2015-08-30 NOTE — ED Provider Notes (Signed)
[facility]-EMERGENCY DEPT Provider Note   CSN: [id number] Arrival date & time: 08/30/15  1035  First Provider Contact:  First MD Initiated Contact with Patient 08/30/15 1136        History   Chief Complaint Chief Complaint  Patient presents with  . Fall    HPI Marilyn Mendez is a 82 y.o. female.  Marilyn Mendez is a 82 y.o. female with hx of prior CVA, DM, HTN, dementia, presents to ED after a fall. Patient's was witnessed by another resident, not witnessed by any staff. Patient apparently was seen tripping over another resident's walker and falling down a carpeted. Not sure if she hit her head. No loss of consciousness. Patient denies any pain. Patient is demented at baseline. According to the staff at the [facility] facility, patient "always wonders around the facility." Patient does not use a walker or cane for ambulation. Hx of falls in the past.       Past Medical History:  Diagnosis Date  . Alzheimer's dementia   . CVA (cerebral infarction)   . Diabetes mellitus without complication (HCC)   . Hypernatremia   . Hyperosmolality   . Hypertension   . Vitamin D deficiency     There are no active problems to display for this patient.   History reviewed. No pertinent surgical history.  OB History    No data available       Home Medications    Prior to Admission medications   Medication Sig Start Date End Date Taking? Authorizing Provider  acetaminophen (TYLENOL) 500 MG tablet Take 500 mg by mouth every 6 (six) hours as needed for mild pain, fever or headache.   Yes Historical Provider, MD  acetaminophen (TYLENOL) 650 MG CR tablet Take 650 mg by mouth 3 (three) times daily as needed for pain.   Yes Historical Provider, MD  alum & mag hydroxide-simeth (MINTOX) 200-200-20 MG/5ML suspension Take 30 mLs by mouth every 6 (six) hours as needed for indigestion or heartburn.   Yes Historical Provider, MD  aspirin 81 MG chewable tablet Chew 81 mg by mouth every  morning.   Yes Historical Provider, MD  Calcium Carb-Cholecalciferol (CALCIUM 500 +D) 500-400 MG-UNIT TABS Take 1 tablet by mouth daily.   Yes Historical Provider, MD  ferrous sulfate 220 (44 Fe) MG/5ML solution Take 330 mg by mouth at bedtime.   Yes Historical Provider, MD  guaifenesin (ROBITUSSIN) 100 MG/5ML syrup Take 200 mg by mouth every 6 (six) hours as needed for cough.   Yes Historical Provider, MD  hydrALAZINE (APRESOLINE) 50 MG tablet Take 50 mg by mouth 3 (three) times daily.   Yes Historical Provider, MD  loperamide (IMODIUM) 2 MG capsule Take 2 mg by mouth as needed for diarrhea or loose stools.    Yes Historical Provider, MD  losartan (COZAAR) 25 MG tablet Take 25 mg by mouth every morning.   Yes Historical Provider, MD  magnesium hydroxide (MILK OF MAGNESIA) 400 MG/5ML suspension Take 30 mLs by mouth daily as needed for mild constipation.   Yes Historical Provider, MD  mirtazapine (REMERON) 15 MG tablet Take 15 mg by mouth at bedtime.   Yes Historical Provider, MD  neomycin-bacitracin-polymyxin (NEOSPORIN) ointment Apply 1 application topically as needed for wound care.    Yes Historical Provider, MD  ondansetron (ZOFRAN) 4 MG tablet Take 4 mg by mouth every 6 (six) hours as needed for nausea or vomiting.   Yes Historical Provider, MD  sodium chloride irrigation 0.9 %  irrigation Irrigate with as directed daily as needed (for wound care- clean area with normal saline, apply neosporin or equivalent, and cover with band aid or gauze/tape).   Yes Historical Provider, MD    Family History History reviewed. No pertinent family history.  Social History Social History  Substance Use Topics  . Smoking status: Never Smoker  . Smokeless tobacco: Not on file  . Alcohol use No     Allergies   Review of patient's allergies indicates no known allergies.   Review of Systems Review of Systems  Unable to perform ROS: Dementia  All other systems reviewed and are negative.    Physical  Exam Updated Vital Signs BP 151/60   Pulse (!) 56   Resp 18   SpO2 100%   Physical Exam  Constitutional: She appears well-developed and well-nourished. No distress.  Eyes: Conjunctivae and EOM are normal. Pupils are equal, round, and reactive to light.  Cardiovascular: Normal rate and regular rhythm.   Murmur heard. Pulmonary/Chest: Effort normal and breath sounds normal. No respiratory distress. She has no wheezes.  Musculoskeletal: Normal range of motion. She exhibits no edema or deformity.  Full ROM of bilateral upper and lower extremities at all joints  Neurological: She is alert.  Answers to her name. Unable to follow directions. Moving all extremities  Skin: Skin is warm. Capillary refill takes less than 2 seconds. No rash noted.  Nursing note and vitals reviewed.    ED Treatments / Results  Labs (all labs ordered are listed, but only abnormal results are displayed) Labs Reviewed - No data to display  EKG  EKG Interpretation None       Radiology No results found.  Procedures Procedures (including critical care time)  Medications Ordered in ED Medications - No data to display   Initial Impression / Assessment and Plan / ED Course  I have reviewed the triage vital signs and the nursing notes.  Pertinent labs & imaging results that were available during my care of the patient were reviewed by me and considered in my medical decision making (see chart for details).  Clinical Course  Comment By Time  Patient seen and examined. I also discussed patient with a nurse, Tammy, at [facility], patient had unwitnessed by staff, but witnessed by other residents fall at the facility today. Patient is demented at baseline. Will get CT head and cervical spine, pelvis x-ray. Will reassess ambulate after if negative. Jaynie Crumble, PA-C 07/26 1200  CTs showing possible hydrocephalus which appears to be chronic finding. Will need further follow up with her doctor. Pt  ambulated in hall. Will dc back to facility Jaynie Crumble, PA-C 07/26 1316    Final Clinical Impressions(s) / ED Diagnoses   Final diagnoses:  Fall, initial encounter    New Prescriptions New Prescriptions   No medications on file     Jaynie Crumble, PA-C 08/30/15 2115    Alvira Monday, MD 09/01/15 [phone]

## 2015-08-30 NOTE — Discharge Instructions (Signed)
CT showing persistent hydrocephalus. Follow up with family doctor regarding this. Fall prevention. Return as needed.

## 2015-09-03 ENCOUNTER — Emergency Department (HOSPITAL_COMMUNITY)
Admission: EM | Admit: 2015-09-03 | Discharge: 2015-09-03 | Disposition: A | Discharge status: Home / Self Care | Payer: Medicare Other | Attending: Emergency Medicine | Admitting: Emergency Medicine

## 2015-09-03 ENCOUNTER — Encounter (HOSPITAL_COMMUNITY): Payer: Self-pay

## 2015-09-03 DIAGNOSIS — W19XXXA Unspecified fall, initial encounter: Secondary | ICD-10-CM | POA: Diagnosis not present

## 2015-09-03 DIAGNOSIS — I10 Essential (primary) hypertension: Secondary | ICD-10-CM | POA: Diagnosis not present

## 2015-09-03 DIAGNOSIS — Z7982 Long term (current) use of aspirin: Secondary | ICD-10-CM | POA: Diagnosis not present

## 2015-09-03 DIAGNOSIS — E119 Type 2 diabetes mellitus without complications: Secondary | ICD-10-CM | POA: Insufficient documentation

## 2015-09-03 DIAGNOSIS — Y999 Unspecified external cause status: Secondary | ICD-10-CM | POA: Insufficient documentation

## 2015-09-03 DIAGNOSIS — F0390 Unspecified dementia without behavioral disturbance: Secondary | ICD-10-CM | POA: Insufficient documentation

## 2015-09-03 DIAGNOSIS — Y939 Activity, unspecified: Secondary | ICD-10-CM | POA: Diagnosis not present

## 2015-09-03 DIAGNOSIS — Z79899 Other long term (current) drug therapy: Secondary | ICD-10-CM | POA: Insufficient documentation

## 2015-09-03 DIAGNOSIS — Y92233 Cafeteria of hospital as the place of occurrence of the external cause: Secondary | ICD-10-CM | POA: Diagnosis not present

## 2015-09-03 LAB — COMPREHENSIVE METABOLIC PANEL
ALK PHOS: 68 U/L (ref 38–126)
ALT: 16 U/L (ref 14–54)
ANION GAP: 7 (ref 5–15)
AST: 22 U/L (ref 15–41)
Albumin: 4.2 g/dL (ref 3.5–5.0)
BILIRUBIN TOTAL: 0.5 mg/dL (ref 0.3–1.2)
BUN: 32 mg/dL — ABNORMAL HIGH (ref 6–20)
CALCIUM: 9.6 mg/dL (ref 8.9–10.3)
CO2: 27 mmol/L (ref 22–32)
Chloride: 106 mmol/L (ref 101–111)
Creatinine, Ser: 1.02 mg/dL — ABNORMAL HIGH (ref 0.44–1.00)
GFR, EST AFRICAN AMERICAN: 59 mL/min — AB (ref >60)
GFR, EST NON AFRICAN AMERICAN: 51 mL/min — AB (ref >60)
GLUCOSE: 101 mg/dL — AB (ref 65–99)
POTASSIUM: 4.3 mmol/L (ref 3.5–5.1)
Sodium: 140 mmol/L (ref 135–145)
TOTAL PROTEIN: 7.6 g/dL (ref 6.5–8.1)

## 2015-09-03 LAB — CBC WITH DIFFERENTIAL/PLATELET
BASOS ABS: 0 10*3/uL (ref 0.0–0.1)
BASOS PCT: 0 %
Eosinophils Absolute: 0.2 10*3/uL (ref 0.0–0.7)
Eosinophils Relative: 2 %
HEMATOCRIT: 31.6 % — AB (ref 36.0–46.0)
Hemoglobin: 9.9 g/dL — ABNORMAL LOW (ref 12.0–15.0)
Lymphocytes Relative: 11 %
Lymphs Abs: 1 10*3/uL (ref 0.7–4.0)
MCH: 26.9 pg (ref 26.0–34.0)
MCHC: 31.3 g/dL (ref 30.0–36.0)
MCV: 85.9 fL (ref 78.0–100.0)
MONO ABS: 0.4 10*3/uL (ref 0.1–1.0)
Monocytes Relative: 5 %
NEUTROS ABS: 7.2 10*3/uL (ref 1.7–7.7)
NEUTROS PCT: 82 %
Platelets: 298 10*3/uL (ref 150–400)
RBC: 3.68 MIL/uL — AB (ref 3.87–5.11)
RDW: 16.2 % — AB (ref 11.5–15.5)
WBC: 8.8 10*3/uL (ref 4.0–10.5)

## 2015-09-03 NOTE — ED Notes (Signed)
I performed a in and out on patient however she did not have anything in her bladder.  Patient just had a incontinent .  I made the nurse aware

## 2015-09-03 NOTE — ED Notes (Addendum)
Pt continues to be sleeping in bed.  No acute distress noted. Family has left. [facility] called for report.

## 2015-09-03 NOTE — ED Triage Notes (Signed)
She comes to [place] from [facility] having been witnessed by staff there to have fallen (not passed out) while standing in their cafeteria. Pt. Is awake, drowsy and confused at what [facility] staff state is her baseline. The paramedic told me pt. C/o the ride here in the ambulance "was too bumpy".

## 2015-09-03 NOTE — ED Notes (Signed)
[facility] on site to take pt back to facility.  Paper scrubs pants placed on pt.  NAD noted on pt.

## 2015-09-03 NOTE — ED Notes (Signed)
Bed: HO12 Expected date:  Expected time:  Means of arrival:  Comments: 82 yo fall, no injury

## 2015-09-03 NOTE — ED Notes (Signed)
[facility] called for transport.  

## 2015-09-03 NOTE — ED Provider Notes (Signed)
[facility]-EMER[facility] DEPT Provider Note   CSN: [id number] Arrival date & time: 09/03/15  1656  History   Chief Complaint Chief Complaint  Patient presents with  . Fall    HPI Marilyn Mendez is a 82 y.o. female.  HPI  Patient presents from nursing facility after a fall. Patient has a history of dementia, level V caveat. Per report the patient had a fall, unwitnessed, but has been otherwise in her usual state of health, including atypical behavior, interactivity, which is essentially nonverbal, though she is ambulatory.   Past Medical History:  Diagnosis Date  . Alzheimer's dementia   . CVA (cerebral infarction)   . Diabetes mellitus without complication (HCC)   . Hypernatremia   . Hyperosmolality   . Hypertension   . Vitamin D deficiency     There are no active problems to display for this patient.   No past surgical history on file.  OB History    No data available       Home Medications    Prior to Admission medications   Medication Sig Start Date End Date Taking? Authorizing Provider  acetaminophen (TYLENOL) 500 MG tablet Take 500 mg by mouth every 4 (four) hours as needed for mild pain, fever or headache.    Yes Historical Provider, MD  acetaminophen (TYLENOL) 650 MG CR tablet Take 650 mg by mouth 3 (three) times daily as needed for pain.   Yes Historical Provider, MD  alum & mag hydroxide-simeth (MINTOX) 200-200-20 MG/5ML suspension Take 30 mLs by mouth every 6 (six) hours as needed for indigestion or heartburn.   Yes Historical Provider, MD  aspirin 81 MG chewable tablet Chew 81 mg by mouth every morning.   Yes Historical Provider, MD  Calcium Carb-Cholecalciferol (CALCIUM 500 +D) 500-400 MG-UNIT TABS Take 1 tablet by mouth daily.   Yes Historical Provider, MD  ferrous sulfate 220 (44 Fe) MG/5ML solution Take 330 mg by mouth at bedtime.   Yes Historical Provider, MD  guaifenesin (ROBITUSSIN) 100 MG/5ML syrup Take 200 mg by mouth every 6 (six) hours as needed  for cough.   Yes Historical Provider, MD  hydrALAZINE (APRESOLINE) 50 MG tablet Take 50 mg by mouth 3 (three) times daily.   Yes Historical Provider, MD  loperamide (IMODIUM) 2 MG capsule Take 2 mg by mouth as needed for diarrhea or loose stools.    Yes Historical Provider, MD  losartan (COZAAR) 25 MG tablet Take 25 mg by mouth every morning.   Yes Historical Provider, MD  magnesium hydroxide (MILK OF MAGNESIA) 400 MG/5ML suspension Take 30 mLs by mouth daily as needed for mild constipation.   Yes Historical Provider, MD  mirtazapine (REMERON) 15 MG tablet Take 15 mg by mouth at bedtime.   Yes Historical Provider, MD  neomycin-bacitracin-polymyxin (NEOSPORIN) ointment Apply 1 application topically as needed for wound care.    Yes Historical Provider, MD  ondansetron (ZOFRAN) 4 MG tablet Take 4 mg by mouth every 6 (six) hours as needed for nausea or vomiting.   Yes Historical Provider, MD  sodium chloride irrigation 0.9 % irrigation Irrigate with as directed daily as needed (for wound care- clean area with normal saline, apply neosporin or equivalent, and cover with band aid or gauze/tape).    Historical Provider, MD    Family History No family history on file.  Social History Social History  Substance Use Topics  . Smoking status: Never Smoker  . Smokeless tobacco: Not on file  . Alcohol use No  Allergies   Review of patient's allergies indicates no known allergies.   Review of Systems Review of Systems  Unable to perform ROS: Dementia     Physical Exam Updated Vital Signs BP 122/73 (BP Location: Right Arm)   Pulse 79   Temp 98.1 F (36.7 C) (Rectal)   Resp 16   SpO2 100%   Physical Exam  Constitutional: No distress.  Cachectic elderly female curled up in bed, nonverbal, but in no distress  HENT:  Head: Normocephalic and atraumatic.  Eyes: Conjunctivae and EOM are normal.  Cardiovascular: Normal rate and regular rhythm.   Pulmonary/Chest: Effort normal and breath  sounds normal. No stridor. No respiratory distress.  Abdominal: She exhibits no distension.  Musculoskeletal: She exhibits no edema.  Neurological: She is alert. No cranial nerve deficit.  Skin: Skin is warm and dry.  Psychiatric: She is withdrawn. Cognition and memory are impaired. She is noncommunicative.  Nursing note and vitals reviewed.    ED Treatments / Results  Labs (all labs ordered are listed, but only abnormal results are displayed) Labs Reviewed  COMPREHENSIVE METABOLIC PANEL - Abnormal; Notable for the following:       Result Value   Glucose, Bld 101 (*)    BUN 32 (*)    Creatinine, Ser 1.02 (*)    GFR calc non Af Amer 51 (*)    GFR calc Af Amer 59 (*)    All other components within normal limits  CBC WITH DIFFERENTIAL/PLATELET - Abnormal; Notable for the following:    RBC 3.68 (*)    Hemoglobin 9.9 (*)    HCT 31.6 (*)    RDW 16.2 (*)    All other components within normal limits  URINALYSIS, ROUTINE W REFLEX MICROSCOPIC (NOT AT [facility])    EKG  EKG Interpretation None       Radiology No results found.  Procedures Procedures (including critical care time)  Medications Ordered in ED Medications - No data to display   Initial Impression / Assessment and Plan / ED Course  I have reviewed the triage vital signs and the nursing notes.  Pertinent labs & imaging results that were available during my care of the patient were reviewed by me and considered in my medical decision making (see chart for details).  Clinical Course  Patient's family has arrived, they state patient is interacting in a typical manner.  Alignment of the patient adequately walks throughout the day and a new nursing facility, and typically when she is tired she just sits or falls down.   On repeat exam the patient is in similar condition, all results discussed with the family.   Final Clinical Impressions(s) / ED Diagnoses   Final diagnoses:  Fall, initial encounter   Elderly  female with dementia, frequent falls presents after a fall. Here she is withdrawn, nonverbal, and according to family interacting in a typical manner. No evidence for trauma, patient does not seem to be any his comfort, has unremarkable vital signs, labs are consistent with prior studies, and she was discharged in stable condition.   Gerhard Munch, MD 09/03/15 2010

## 2015-09-03 NOTE — Discharge Instructions (Signed)
As discussed, your evaluation today has been largely reassuring.  But, it is important that you monitor your condition carefully, and do not hesitate to return to the ED if you develop new, or concerning changes in your condition. ? ?Otherwise, please follow-up with your physician for appropriate ongoing care. ? ?

## 2015-09-09 ENCOUNTER — Encounter (HOSPITAL_COMMUNITY): Payer: Self-pay

## 2015-09-09 ENCOUNTER — Emergency Department (HOSPITAL_COMMUNITY)
Admission: EM | Admit: 2015-09-09 | Discharge: 2015-09-09 | Disposition: A | Discharge status: Home / Self Care | Payer: Medicare Other | Attending: Emergency Medicine | Admitting: Emergency Medicine

## 2015-09-09 DIAGNOSIS — I10 Essential (primary) hypertension: Secondary | ICD-10-CM | POA: Insufficient documentation

## 2015-09-09 DIAGNOSIS — G308 Other Alzheimer's disease: Secondary | ICD-10-CM | POA: Insufficient documentation

## 2015-09-09 DIAGNOSIS — Y939 Activity, unspecified: Secondary | ICD-10-CM | POA: Diagnosis not present

## 2015-09-09 DIAGNOSIS — E119 Type 2 diabetes mellitus without complications: Secondary | ICD-10-CM | POA: Insufficient documentation

## 2015-09-09 DIAGNOSIS — Y929 Unspecified place or not applicable: Secondary | ICD-10-CM | POA: Diagnosis not present

## 2015-09-09 DIAGNOSIS — S0990XA Unspecified injury of head, initial encounter: Secondary | ICD-10-CM | POA: Diagnosis present

## 2015-09-09 DIAGNOSIS — S01112A Laceration without foreign body of left eyelid and periocular area, initial encounter: Secondary | ICD-10-CM | POA: Diagnosis not present

## 2015-09-09 DIAGNOSIS — IMO0002 Reserved for concepts with insufficient information to code with codable children: Secondary | ICD-10-CM

## 2015-09-09 DIAGNOSIS — Y999 Unspecified external cause status: Secondary | ICD-10-CM | POA: Diagnosis not present

## 2015-09-09 DIAGNOSIS — T148XXA Other injury of unspecified body region, initial encounter: Secondary | ICD-10-CM

## 2015-09-09 DIAGNOSIS — W19XXXA Unspecified fall, initial encounter: Secondary | ICD-10-CM

## 2015-09-09 DIAGNOSIS — W07XXXA Fall from chair, initial encounter: Secondary | ICD-10-CM | POA: Insufficient documentation

## 2015-09-09 NOTE — ED Provider Notes (Signed)
[facility] Provider Note   CSN: [id number] Arrival date & time: 09/09/15  1450  First Provider Contact:  First MD Initiated Contact with Patient 09/09/15 1556        History   Chief Complaint Chief Complaint  Patient presents with  . Fall  . Head Injury    HPI Marilyn Mendez is a 82 y.o. female.  HPI   82 year old female with history of Alzheimer's dementia, frequent falls, presents with concern for fall from [place]. Patient reportedly slid out of her wheelchair and hit her head. She had bleeding with small laceration. Incident happened just prior to arrival. Daughter reports that she's had frequent falls, since moving to [facility] [place]. Reports that patient always wants to be moving, however does not have enough balance to do so, and falls down. Patient is otherwise at baseline. There is no fevers, no change in mental status.  Daughter reports that recently patient had evaluation of blood and urine, which were normal, however patient went through great amount of pain in order to get these tests done, and now, patient's daughter would like to focus on comfort patient, and minimal testing.   Past Medical History:  Diagnosis Date  . Alzheimer's dementia   . CVA (cerebral infarction)   . Diabetes mellitus without complication (HCC)   . Hypernatremia   . Hyperosmolality   . Hypertension   . Vitamin D deficiency     There are no active problems to display for this patient.   No past surgical history on file.  OB History    No data available       Home Medications    Prior to Admission medications   Medication Sig Start Date End Date Taking? Authorizing Provider  acetaminophen (TYLENOL) 500 MG tablet Take 500 mg by mouth every 4 (four) hours as needed for mild pain, fever or headache.     Historical Provider, MD  acetaminophen (TYLENOL) 650 MG CR tablet Take 650 mg by mouth 3 (three) times daily as needed for pain.    Historical Provider, MD  alum  & mag hydroxide-simeth (MINTOX) 200-200-20 MG/5ML suspension Take 30 mLs by mouth every 6 (six) hours as needed for indigestion or heartburn.    Historical Provider, MD  aspirin 81 MG chewable tablet Chew 81 mg by mouth every morning.    Historical Provider, MD  Calcium Carb-Cholecalciferol (CALCIUM 500 +D) 500-400 MG-UNIT TABS Take 1 tablet by mouth daily.    Historical Provider, MD  ferrous sulfate 220 (44 Fe) MG/5ML solution Take 330 mg by mouth at bedtime.    Historical Provider, MD  guaifenesin (ROBITUSSIN) 100 MG/5ML syrup Take 200 mg by mouth every 6 (six) hours as needed for cough.    Historical Provider, MD  hydrALAZINE (APRESOLINE) 50 MG tablet Take 50 mg by mouth 3 (three) times daily.    Historical Provider, MD  loperamide (IMODIUM) 2 MG capsule Take 2 mg by mouth as needed for diarrhea or loose stools.     Historical Provider, MD  losartan (COZAAR) 25 MG tablet Take 25 mg by mouth every morning.    Historical Provider, MD  magnesium hydroxide (MILK OF MAGNESIA) 400 MG/5ML suspension Take 30 mLs by mouth daily as needed for mild constipation.    Historical Provider, MD  mirtazapine (REMERON) 15 MG tablet Take 15 mg by mouth at bedtime.    Historical Provider, MD  neomycin-bacitracin-polymyxin (NEOSPORIN) ointment Apply 1 application topically as needed for wound care.  Historical Provider, MD  ondansetron (ZOFRAN) 4 MG tablet Take 4 mg by mouth every 6 (six) hours as needed for nausea or vomiting.    Historical Provider, MD  sodium chloride irrigation 0.9 % irrigation Irrigate with as directed daily as needed (for wound care- clean area with normal saline, apply neosporin or equivalent, and cover with band aid or gauze/tape).    Historical Provider, MD    Family History No family history on file.  Social History Social History  Substance Use Topics  . Smoking status: Never Smoker  . Smokeless tobacco: Not on file  . Alcohol use No     Allergies   Review of patient's  allergies indicates no known allergies.   Review of Systems Review of Systems  Unable to perform ROS: Dementia     Physical Exam Updated Vital Signs BP 110/63 (BP Location: Left Arm)   Pulse 64   Temp 97.8 F (36.6 C)   Resp 16   SpO2 100%   Physical Exam  Constitutional: She appears well-developed. She appears cachectic. No distress.  Withdrawn (baseline), when woken, opens eyes, readjusts position, moves all 4 extremities, comfortable in no acute distress  HENT:  Head: Normocephalic.  Left eyebrow contusion, superficial laceration 1cm lateral to left eyebrow  Eyes: Conjunctivae and EOM are normal. Pupils are equal, round, and reactive to light.  Neck: Normal range of motion.  Cardiovascular: Normal rate, regular rhythm, normal heart sounds and intact distal pulses.  Exam reveals no gallop and no friction rub.   No murmur heard. Pulmonary/Chest: Effort normal and breath sounds normal. No respiratory distress. She has no wheezes. She has no rales.  Abdominal: Soft. She exhibits no distension. There is no tenderness. There is no guarding.  Musculoskeletal: She exhibits no edema or tenderness.  Does not appear to have pain or tenderness palpating CSpine, bilat arms legs with painless passive ROM  Neurological: She is alert. GCS eye subscore is 3. GCS verbal subscore is 4. GCS motor subscore is 5.  Patient initially sleepy, then states "I am sitting right here" which daughter reports is more words than she has said in a year Symmetric facies, spontaneous EOM appear normal Moves all 4 extremities   Skin: Skin is warm and dry. Laceration (superficial 1cm) noted. No rash noted. She is not diaphoretic. No erythema.  Nursing note and vitals reviewed.    ED Treatments / Results  Labs (all labs ordered are listed, but only abnormal results are displayed) Labs Reviewed - No data to display  EKG  EKG Interpretation None       Radiology No results  found.  Procedures Procedures (including critical care time)  Medications Ordered in ED Medications - No data to display   Initial Impression / Assessment and Plan / ED Course  I have reviewed the triage vital signs and the nursing notes.  Pertinent labs & imaging results that were available during my care of the patient were reviewed by me and considered in my medical decision making (see chart for details).  Clinical Course   82 year old female with history of Alzheimer's dementia, frequent falls, presents with concern for fall from [place]. Daughter is at the bedside, and discussed possibility of CT imaging of the head and cervical spine, however this time she declines testing. Reports that they would like to focus on dignity with end-of-life care, decrease testing, and focus on comfort.  Patient with very superficial laceration above her left eyebrow. Given superficial nature, this will heal  by secondary intention, and recommend wound care, antibiotic ointment and dressings. Patient does not appear to have any other areas of tenderness on exam, is spontaneously moving all 4 extremities, and is mentally at her baseline.  Patient discharged in stable condition with understanding of reasons to return.   Final Clinical Impressions(s) / ED Diagnoses   Final diagnoses:  Head injury, initial encounter  Fall, initial encounter  Laceration  Contusion    New Prescriptions New Prescriptions   No medications on file     Alvira Monday, MD 09/09/15 1610

## 2015-09-09 NOTE — ED Triage Notes (Signed)
Staff at [facility] saw pt. "slide out of her wheelchair onto the floor".  She has sm. Area of swelling with very sm. Superficial lac./abrasion at just lat. To lateral aspect of eyebrow.  She is awake and confused; which [facility] staff told EMS is her baseline.

## 2015-11-19 ENCOUNTER — Emergency Department (HOSPITAL_COMMUNITY)
Admission: EM | Admit: 2015-11-19 | Discharge: 2015-11-19 | Disposition: A | Discharge status: Home / Self Care | Payer: Medicare Other | Attending: Emergency Medicine | Admitting: Emergency Medicine

## 2015-11-19 ENCOUNTER — Encounter (HOSPITAL_COMMUNITY): Payer: Self-pay | Admitting: Emergency Medicine

## 2015-11-19 DIAGNOSIS — Y999 Unspecified external cause status: Secondary | ICD-10-CM | POA: Diagnosis not present

## 2015-11-19 DIAGNOSIS — I10 Essential (primary) hypertension: Secondary | ICD-10-CM | POA: Insufficient documentation

## 2015-11-19 DIAGNOSIS — Y92129 Unspecified place in nursing home as the place of occurrence of the external cause: Secondary | ICD-10-CM | POA: Diagnosis not present

## 2015-11-19 DIAGNOSIS — Z7982 Long term (current) use of aspirin: Secondary | ICD-10-CM | POA: Diagnosis not present

## 2015-11-19 DIAGNOSIS — W07XXXA Fall from chair, initial encounter: Secondary | ICD-10-CM | POA: Diagnosis not present

## 2015-11-19 DIAGNOSIS — Y939 Activity, unspecified: Secondary | ICD-10-CM | POA: Insufficient documentation

## 2015-11-19 DIAGNOSIS — Z79899 Other long term (current) drug therapy: Secondary | ICD-10-CM | POA: Diagnosis not present

## 2015-11-19 DIAGNOSIS — F0390 Unspecified dementia without behavioral disturbance: Secondary | ICD-10-CM | POA: Insufficient documentation

## 2015-11-19 DIAGNOSIS — W19XXXA Unspecified fall, initial encounter: Secondary | ICD-10-CM

## 2015-11-19 NOTE — ED Provider Notes (Signed)
[facility] DEPT Provider Note   CSN: [id number] Arrival date & time: 11/19/15  1208     History   Chief Complaint Chief Complaint  Patient presents with  . Fall    HPI Marilyn Mendez is a 82 y.o. female.  She presents for evaluation of fall from sitting position in a rocker. Apparently she has fallen numerous times from this same walker. Her daughter came to the ER to see her after. EMS brought her for evaluation. Her daughter told the nurse that the patient is at her baseline. She did not feel that she needed any further evaluation or treatment. Her daughter is comfortable with observational evaluation. The patient is unable to give any history  Level V caveat-dementia  HPI  Past Medical History:  Diagnosis Date  . Alzheimer's dementia   . CVA (cerebral infarction)   . Diabetes mellitus without complication (HCC)   . Hypernatremia   . Hyperosmolality   . Hypertension   . Vitamin D deficiency     There are no active problems to display for this patient.   History reviewed. No pertinent surgical history.  OB History    No data available       Home Medications    Prior to Admission medications   Medication Sig Start Date End Date Taking? Authorizing Provider  acetaminophen (TYLENOL) 500 MG tablet Take 500 mg by mouth every 4 (four) hours as needed for mild pain, fever or headache.     Historical Provider, MD  acetaminophen (TYLENOL) 650 MG CR tablet Take 650 mg by mouth 3 (three) times daily as needed for pain.    Historical Provider, MD  alum & mag hydroxide-simeth (MINTOX) 200-200-20 MG/5ML suspension Take 30 mLs by mouth every 6 (six) hours as needed for indigestion or heartburn.    Historical Provider, MD  aspirin 81 MG chewable tablet Chew 81 mg by mouth every morning.    Historical Provider, MD  Calcium Carb-Cholecalciferol (CALCIUM 500 +D) 500-400 MG-UNIT TABS Take 1 tablet by mouth daily.    Historical Provider, MD  ferrous sulfate 220 (44 Fe)  MG/5ML solution Take 330 mg by mouth at bedtime.    Historical Provider, MD  guaifenesin (ROBITUSSIN) 100 MG/5ML syrup Take 200 mg by mouth every 6 (six) hours as needed for cough.    Historical Provider, MD  hydrALAZINE (APRESOLINE) 50 MG tablet Take 50 mg by mouth 3 (three) times daily.    Historical Provider, MD  loperamide (IMODIUM) 2 MG capsule Take 2 mg by mouth as needed for diarrhea or loose stools.     Historical Provider, MD  losartan (COZAAR) 25 MG tablet Take 25 mg by mouth every morning.    Historical Provider, MD  magnesium hydroxide (MILK OF MAGNESIA) 400 MG/5ML suspension Take 30 mLs by mouth daily as needed for mild constipation.    Historical Provider, MD  mirtazapine (REMERON) 15 MG tablet Take 15 mg by mouth at bedtime.    Historical Provider, MD  neomycin-bacitracin-polymyxin (NEOSPORIN) ointment Apply 1 application topically as needed for wound care.     Historical Provider, MD  ondansetron (ZOFRAN) 4 MG tablet Take 4 mg by mouth every 6 (six) hours as needed for nausea or vomiting.    Historical Provider, MD  sodium chloride irrigation 0.9 % irrigation Irrigate with as directed daily as needed (for wound care- clean area with normal saline, apply neosporin or equivalent, and cover with band aid or gauze/tape).    Historical Provider, MD  Family History History reviewed. No pertinent family history.  Social History Social History  Substance Use Topics  . Smoking status: Never Smoker  . Smokeless tobacco: Not on file  . Alcohol use No     Allergies   Review of patient's allergies indicates no known allergies.   Review of Systems Review of Systems  Unable to perform ROS: Dementia     Physical Exam Updated Vital Signs BP (!) 106/50   Pulse (!) 53   Temp 98.8 F (37.1 C)   Resp 16   SpO2 100%   Physical Exam  Constitutional: She appears well-developed.  Elderly, frail  HENT:  Head: Normocephalic and atraumatic.  Eyes: Conjunctivae and EOM are  normal. Pupils are equal, round, and reactive to light.  Neck: Normal range of motion and phonation normal. Neck supple.  Cardiovascular: Normal rate and regular rhythm.   Pulmonary/Chest: Effort normal and breath sounds normal. She exhibits no tenderness.  Abdominal: She exhibits no distension. There is no tenderness. There is no guarding.  Musculoskeletal:  No large joint deformities. Moderate flexion contractures of elbows and knees bilaterally.  Neurological: She is alert. She exhibits normal muscle tone.  Skin: Skin is warm and dry.  Psychiatric:  Calm, responds to voice, but is nonverbal.  Nursing note and vitals reviewed.    ED Treatments / Results  Labs (all labs ordered are listed, but only abnormal results are displayed) Labs Reviewed - No data to display  EKG  EKG Interpretation None       Radiology No results found.  Procedures Procedures (including critical care time)  Medications Ordered in ED Medications - No data to display   Initial Impression / Assessment and Plan / ED Course  I have reviewed the triage vital signs and the nursing notes.  Pertinent labs & imaging results that were available during my care of the patient were reviewed by me and considered in my medical decision making (see chart for details).  Clinical Course    Medications - No data to display  Patient Vitals for the past 24 hrs:  BP Temp Pulse Resp SpO2  11/19/15 1218 (!) 106/50 98.8 F (37.1 C) (!) 53 16 100 %    2:39 PM Reevaluation with update and discussion. After initial assessment and treatment, an updated evaluation reveals No change in clinical status. Kasee Hantz L    Final Clinical Impressions(s) / ED Diagnoses   Final diagnoses:  Fall, initial encounter   Fall, without visible injury. Patient appears to be, and is reported to be, at her baseline. No indication for further evaluation or intervention at this time.  Nursing Notes Reviewed/ Care  Coordinated Applicable Imaging Reviewed Interpretation of Laboratory Data incorporated into ED treatment  The patient appears reasonably screened and/or stabilized for discharge and I doubt any other medical condition or other [facility] requiring further screening, evaluation, or treatment in the ED at this time prior to discharge.  Plan: Home Medications- continue; Home Treatments- rest; return here if the recommended treatment, does not improve the symptoms; Recommended follow up- PCP prn  New Prescriptions New Prescriptions   No medications on file     Mancel Bale, MD 11/19/15 1442

## 2015-11-19 NOTE — ED Triage Notes (Addendum)
Pt slid out of chair onto floor in nursing home. Pt with recent falls from rocker / chair in nursing facility. No c/o from patient,family at bedside on arrival and states they do not wish for patient to have "any extreme treatment or testing" and states pt is a DNR even though paperwork states full code. Pt alert and oriented to self.

## 2015-11-19 NOTE — Discharge Instructions (Signed)
Return here if needed for problems. °

## 2015-11-28 ENCOUNTER — Encounter (HOSPITAL_COMMUNITY): Payer: Self-pay | Admitting: Emergency Medicine

## 2015-11-28 ENCOUNTER — Emergency Department (HOSPITAL_COMMUNITY)
Admission: EM | Admit: 2015-11-28 | Discharge: 2015-11-28 | Disposition: A | Discharge status: Home / Self Care | Payer: Medicare Other | Attending: Emergency Medicine | Admitting: Emergency Medicine

## 2015-11-28 DIAGNOSIS — S0003XA Contusion of scalp, initial encounter: Secondary | ICD-10-CM | POA: Insufficient documentation

## 2015-11-28 DIAGNOSIS — E119 Type 2 diabetes mellitus without complications: Secondary | ICD-10-CM | POA: Diagnosis not present

## 2015-11-28 DIAGNOSIS — I10 Essential (primary) hypertension: Secondary | ICD-10-CM | POA: Diagnosis not present

## 2015-11-28 DIAGNOSIS — W07XXXA Fall from chair, initial encounter: Secondary | ICD-10-CM | POA: Diagnosis not present

## 2015-11-28 DIAGNOSIS — G309 Alzheimer's disease, unspecified: Secondary | ICD-10-CM | POA: Insufficient documentation

## 2015-11-28 DIAGNOSIS — Y999 Unspecified external cause status: Secondary | ICD-10-CM | POA: Diagnosis not present

## 2015-11-28 DIAGNOSIS — W19XXXA Unspecified fall, initial encounter: Secondary | ICD-10-CM

## 2015-11-28 DIAGNOSIS — Y939 Activity, unspecified: Secondary | ICD-10-CM | POA: Insufficient documentation

## 2015-11-28 DIAGNOSIS — Y92129 Unspecified place in nursing home as the place of occurrence of the external cause: Secondary | ICD-10-CM | POA: Insufficient documentation

## 2015-11-28 DIAGNOSIS — S0990XA Unspecified injury of head, initial encounter: Secondary | ICD-10-CM | POA: Diagnosis present

## 2015-11-28 DIAGNOSIS — Z7982 Long term (current) use of aspirin: Secondary | ICD-10-CM | POA: Insufficient documentation

## 2015-11-28 DIAGNOSIS — Z79899 Other long term (current) drug therapy: Secondary | ICD-10-CM | POA: Diagnosis not present

## 2015-11-28 NOTE — ED Provider Notes (Signed)
[facility]-EMERGENCY DEPT Provider Note   CSN: [id number] Arrival date & time: 11/28/15  1545     History   Chief Complaint Chief Complaint  Patient presents with  . Fall  . Head Injury    HPI Marilyn Mendez is a 82 y.o. female.  LEVEL 5 CAVEAT--DEMENTIA.  She is unable to provide any history.   HPI Marilyn Mendez is a 82 y.o. female with PMH significant for Dementia BIB EMS from [facility] with unwitnessed fall out of her chair today approximately 2 PM. Unknown downtime.  She has been in her usual state of health.  History of frequent falls similar to this.  She is at her mental baseline. She denies any pain. Family at bedside and requesting minimal testing performed as this is a recurrent problem and blood work is traumatizing to the patient.  Reports she is at her baseline.  They defer further imaging as well.   Past Medical History:  Diagnosis Date  . Alzheimer's dementia   . CVA (cerebral infarction)   . Diabetes mellitus without complication (HCC)   . Hypernatremia   . Hyperosmolality   . Hypertension   . Vitamin D deficiency     There are no active problems to display for this patient.   History reviewed. No pertinent surgical history.  OB History    No data available       Home Medications    Prior to Admission medications   Medication Sig Start Date End Date Taking? Authorizing Provider  aspirin 81 MG chewable tablet Chew 81 mg by mouth every morning.   Yes Historical Provider, MD  Calcium Carb-Cholecalciferol (CALCIUM 500 +D) 500-400 MG-UNIT TABS Take 1 tablet by mouth daily.   Yes Historical Provider, MD  diphenhydrAMINE (BENADRYL) 25 mg capsule Take 25 mg by mouth 2 (two) times daily as needed for itching.   Yes Historical Provider, MD  ferrous sulfate 220 (44 Fe) MG/5ML solution Take 330 mg by mouth at bedtime.   Yes Historical Provider, MD  hydrALAZINE (APRESOLINE) 50 MG tablet Take 50 mg by mouth 3 (three) times daily.   Yes Historical  Provider, MD  losartan (COZAAR) 25 MG tablet Take 25 mg by mouth every morning.   Yes Historical Provider, MD  acetaminophen (TYLENOL) 500 MG tablet Take 500 mg by mouth every 4 (four) hours as needed for mild pain, fever or headache.     Historical Provider, MD  acetaminophen (TYLENOL) 650 MG CR tablet Take 650 mg by mouth 3 (three) times daily as needed for pain.    Historical Provider, MD  alum & mag hydroxide-simeth (MINTOX) 200-200-20 MG/5ML suspension Take 30 mLs by mouth every 6 (six) hours as needed for indigestion or heartburn.    Historical Provider, MD  guaifenesin (ROBITUSSIN) 100 MG/5ML syrup Take 200 mg by mouth every 6 (six) hours as needed for cough.    Historical Provider, MD  loperamide (IMODIUM) 2 MG capsule Take 2 mg by mouth as needed for diarrhea or loose stools.     Historical Provider, MD  magnesium hydroxide (MILK OF MAGNESIA) 400 MG/5ML suspension Take 30 mLs by mouth daily as needed for mild constipation.    Historical Provider, MD  neomycin-bacitracin-polymyxin (NEOSPORIN) ointment Apply 1 application topically as needed for wound care.     Historical Provider, MD  ondansetron (ZOFRAN) 4 MG tablet Take 4 mg by mouth every 6 (six) hours as needed for nausea or vomiting.    Historical Provider, MD  sodium chloride  irrigation 0.9 % irrigation Irrigate with as directed daily as needed (for wound care- clean area with normal saline, apply neosporin or equivalent, and cover with band aid or gauze/tape).    Historical Provider, MD    Family History No family history on file.  Social History Social History  Substance Use Topics  . Smoking status: Never Smoker  . Smokeless tobacco: Never Used  . Alcohol use No     Allergies   Review of patient's allergies indicates no known allergies.   Review of Systems Review of Systems  Unable to perform ROS: Dementia     Physical Exam Updated Vital Signs BP 102/56 (BP Location: Left Arm)   Pulse 73   Temp 98.9 F (37.2  C) (Oral)   Resp 16   SpO2 100%   Physical Exam  Constitutional: She appears well-developed and well-nourished.  Non-toxic appearance. She does not have a sickly appearance. She does not appear ill.  Elderly, frail female curled up in bed.   HENT:  Head: Normocephalic.  Mouth/Throat: Oropharynx is clear and moist.  Small hematoma to forehead. Small abrasion to right scalp. No bony instability or crepitus.   Eyes: Conjunctivae are normal. Pupils are equal, round, and reactive to light.  Spontaneous EOMs intact.   Neck: Normal range of motion. Neck supple.  Cardiovascular: Normal rate and regular rhythm.   Pulmonary/Chest: Effort normal and breath sounds normal. No accessory muscle usage or stridor. No respiratory distress. She has no wheezes. She has no rhonchi. She has no rales.  Abdominal: Soft. Bowel sounds are normal. She exhibits no distension. There is no tenderness.  Musculoskeletal: Normal range of motion.  No obvious signs of trauma aside from frontal hematoma.   Lymphadenopathy:    She has no cervical adenopathy.  Neurological: She is alert.  Nonverbal, but responds to verbal stimuli.   Skin: Skin is warm and dry.  Psychiatric: She has a normal mood and affect. Her behavior is normal.     ED Treatments / Results  Labs (all labs ordered are listed, but only abnormal results are displayed) Labs Reviewed - No data to display  EKG  EKG Interpretation None       Radiology No results found.  Procedures Procedures (including critical care time)  Medications Ordered in ED Medications - No data to display   Initial Impression / Assessment and Plan / ED Course  I have reviewed the triage vital signs and the nursing notes.  Pertinent labs & imaging results that were available during my care of the patient were reviewed by me and considered in my medical decision making (see chart for details).  Clinical Course   Patient presents with mechanical fall.  Family at  bedside declines further evaluation with CT or blood work.  States this is normal for her, and she is at her baseline.  Small hematoma and abrasion to frontal and right scalp.  No crepitus or bony instability.  No other signs of trauma.  Feel comfortable with observation and no further work up.    Case has been discussed with and seen by Dr. Effie ShyWentz who agrees with the above plan for discharge.   Final Clinical Impressions(s) / ED Diagnoses   Final diagnoses:  Fall, initial encounter  Hematoma of scalp, initial encounter    New Prescriptions New Prescriptions   No medications on file     Cheri FowlerKayla Masashi Snowdon, PA-C 11/28/15 1723    Mancel BaleElliott Wentz, MD 11/29/15 0111

## 2015-11-28 NOTE — ED Triage Notes (Signed)
[facility] today out of chair. Hit head. Abrasion noted to right side of head. Non verbal.

## 2015-11-28 NOTE — ED Notes (Signed)
Pt's family members stated that last time they were here was the last time they were suppose to draw blood from the patient.

## 2015-11-28 NOTE — ED Provider Notes (Signed)
  Face-to-face evaluation   History: Here for fall from chair, similar problems. Multiple times. I saw the patient. The same thing about 9 days ago. She continues to be demented.  Physical exam: Alert, demented my right lateral decubitus, fetal position. Minimal abrasion and contusion, right scalp. No crepitation or deformity  Medical screening examination/treatment/procedure(s) were conducted as a shared visit with non-physician practitioner(s) and myself.  I personally evaluated the patient during the encounter   Mancel BaleElliott Jeptha Hinnenkamp, MD 11/29/15 0110

## 2015-11-28 NOTE — ED Notes (Signed)
Bed: WA13 Expected date:  Expected time:  Means of arrival:  Comments: EMS-fall 

## 2015-11-28 NOTE — ED Notes (Signed)
Family member state PT is here only for a fall and does not need blood since last visit doctor place note in chart for no labs. PA have been made aware

## 2015-11-28 NOTE — ED Notes (Signed)
[facility] called  

## 2015-11-28 NOTE — Progress Notes (Signed)
[facility] in last 6 months  ED Cm spoke with director of nursing of [facility]  [phone]) [phone] States pt sleeps at night & walks all day State today the pt bent over to have BM in her depends that was witnessed Reports pt likes to sleep with her head on her knees in her w/c Reports their Goals is to have staff take pt to bathroom and walk with her from now on

## 2015-12-01 ENCOUNTER — Encounter (HOSPITAL_COMMUNITY): Payer: Self-pay | Admitting: Emergency Medicine

## 2015-12-01 ENCOUNTER — Emergency Department (HOSPITAL_COMMUNITY)
Admission: EM | Admit: 2015-12-01 | Discharge: 2015-12-01 | Disposition: A | Discharge status: Home / Self Care | Payer: Medicare Other | Attending: Emergency Medicine | Admitting: Emergency Medicine

## 2015-12-01 ENCOUNTER — Emergency Department (HOSPITAL_COMMUNITY): Payer: Medicare Other

## 2015-12-01 DIAGNOSIS — G309 Alzheimer's disease, unspecified: Secondary | ICD-10-CM | POA: Insufficient documentation

## 2015-12-01 DIAGNOSIS — Z7982 Long term (current) use of aspirin: Secondary | ICD-10-CM | POA: Insufficient documentation

## 2015-12-01 DIAGNOSIS — W050XXA Fall from non-moving wheelchair, initial encounter: Secondary | ICD-10-CM | POA: Insufficient documentation

## 2015-12-01 DIAGNOSIS — S0990XA Unspecified injury of head, initial encounter: Secondary | ICD-10-CM | POA: Diagnosis present

## 2015-12-01 DIAGNOSIS — I10 Essential (primary) hypertension: Secondary | ICD-10-CM | POA: Insufficient documentation

## 2015-12-01 DIAGNOSIS — Z79899 Other long term (current) drug therapy: Secondary | ICD-10-CM | POA: Insufficient documentation

## 2015-12-01 DIAGNOSIS — Y999 Unspecified external cause status: Secondary | ICD-10-CM | POA: Insufficient documentation

## 2015-12-01 DIAGNOSIS — Y92129 Unspecified place in nursing home as the place of occurrence of the external cause: Secondary | ICD-10-CM | POA: Insufficient documentation

## 2015-12-01 DIAGNOSIS — E119 Type 2 diabetes mellitus without complications: Secondary | ICD-10-CM | POA: Diagnosis not present

## 2015-12-01 DIAGNOSIS — Y939 Activity, unspecified: Secondary | ICD-10-CM | POA: Diagnosis not present

## 2015-12-01 DIAGNOSIS — S0083XA Contusion of other part of head, initial encounter: Secondary | ICD-10-CM | POA: Diagnosis not present

## 2015-12-01 DIAGNOSIS — W19XXXA Unspecified fall, initial encounter: Secondary | ICD-10-CM

## 2015-12-01 NOTE — ED Provider Notes (Signed)
[facility]-EMERGENCY DEPT Provider Note   CSN: [id number] Arrival date & time: 12/01/15  1139     History   Chief Complaint Chief Complaint  Patient presents with  . Fall    HPI Marilyn Mendez is a 82 y.o. female.  82 year old African-American female past medical history significant for Alzheimer's dementia presents to the ED today by EMS after fall from wheelchair. Patient was seen multiple times in the ER for same. Last saw 3 days ago for same. Per nursing staff at [facility] patient was sleeping in her wheelchair when she fell forward and hit her head on the ground. Nursing staff denies LOC. Patient is not on blood thinners. Per nursing staff she is at her mental baseline. She denies any pain. She is oriented to name only which is baseline for patient. Patient has been with family in the past in the ER and has requested minimal testing due to recurrent problem. The patient and provide any history.       Past Medical History:  Diagnosis Date  . Alzheimer's dementia   . CVA (cerebral infarction)   . Diabetes mellitus without complication (HCC)   . Hypernatremia   . Hyperosmolality   . Hypertension   . Vitamin D deficiency     There are no active problems to display for this patient.   History reviewed. No pertinent surgical history.  OB History    No data available       Home Medications    Prior to Admission medications   Medication Sig Start Date End Date Taking? Authorizing Provider  acetaminophen (TYLENOL) 500 MG tablet Take 500 mg by mouth every 4 (four) hours as needed for mild pain, fever or headache.     Historical Provider, MD  acetaminophen (TYLENOL) 650 MG CR tablet Take 650 mg by mouth 3 (three) times daily as needed for pain.    Historical Provider, MD  alum & mag hydroxide-simeth (MINTOX) 200-200-20 MG/5ML suspension Take 30 mLs by mouth every 6 (six) hours as needed for indigestion or heartburn.    Historical Provider, MD  aspirin 81 MG  chewable tablet Chew 81 mg by mouth every morning.    Historical Provider, MD  Calcium Carb-Cholecalciferol (CALCIUM 500 +D) 500-400 MG-UNIT TABS Take 1 tablet by mouth daily.    Historical Provider, MD  diphenhydrAMINE (BENADRYL) 25 mg capsule Take 25 mg by mouth 2 (two) times daily as needed for itching.    Historical Provider, MD  ferrous sulfate 220 (44 Fe) MG/5ML solution Take 330 mg by mouth at bedtime.    Historical Provider, MD  guaifenesin (ROBITUSSIN) 100 MG/5ML syrup Take 200 mg by mouth every 6 (six) hours as needed for cough.    Historical Provider, MD  hydrALAZINE (APRESOLINE) 50 MG tablet Take 50 mg by mouth 3 (three) times daily.    Historical Provider, MD  loperamide (IMODIUM) 2 MG capsule Take 2 mg by mouth as needed for diarrhea or loose stools.     Historical Provider, MD  losartan (COZAAR) 25 MG tablet Take 25 mg by mouth every morning.    Historical Provider, MD  magnesium hydroxide (MILK OF MAGNESIA) 400 MG/5ML suspension Take 30 mLs by mouth daily as needed for mild constipation.    Historical Provider, MD  neomycin-bacitracin-polymyxin (NEOSPORIN) ointment Apply 1 application topically as needed for wound care.     Historical Provider, MD  ondansetron (ZOFRAN) 4 MG tablet Take 4 mg by mouth every 6 (six) hours as needed for nausea  or vomiting.    Historical Provider, MD  sodium chloride irrigation 0.9 % irrigation Irrigate with as directed daily as needed (for wound care- clean area with normal saline, apply neosporin or equivalent, and cover with band aid or gauze/tape).    Historical Provider, MD    Family History History reviewed. No pertinent family history.  Social History Social History  Substance Use Topics  . Smoking status: Never Smoker  . Smokeless tobacco: Never Used  . Alcohol use No     Allergies   Review of patient's allergies indicates no known allergies.   Review of Systems Review of Systems  Unable to perform ROS: Dementia  All other systems  reviewed and are negative.    Physical Exam Updated Vital Signs BP 112/62   Pulse 66   Resp 15   SpO2 98%   Physical Exam  Constitutional: She appears well-developed and well-nourished.  Elderly and frail  HENT:  Head: Normocephalic and atraumatic.  No obvious signs of trauma besides small hematoma noted to the right parietal region. No skull depression or crepitus noted.  Eyes: EOM are normal. Pupils are equal, round, and reactive to light.  Neck: Normal range of motion. Neck supple. No thyromegaly present.  Cardiovascular: Normal rate, regular rhythm, normal heart sounds and intact distal pulses.   Pulmonary/Chest: Effort normal and breath sounds normal.  Abdominal: Soft. Bowel sounds are normal. She exhibits no distension. There is no tenderness. There is no rebound and no guarding.  Lymphadenopathy:    She has no cervical adenopathy.  Neurological:  Patient is non verbal but responds to verbal stimuli. Oriented to person only, but is baseline for patient. Unable to perform neuro exam on patient.  Skin: Skin is warm and dry. Capillary refill takes less than 2 seconds.  Nursing note and vitals reviewed.    ED Treatments / Results  Labs (all labs ordered are listed, but only abnormal results are displayed) Labs Reviewed - No data to display  EKG  EKG Interpretation None       Radiology No results found.  Procedures Procedures (including critical care time)  Medications Ordered in ED Medications - No data to display   Initial Impression / Assessment and Plan / ED Course  I have reviewed the triage vital signs and the nursing notes.  Pertinent labs & imaging results that were available during my care of the patient were reviewed by me and considered in my medical decision making (see chart for details).  Clinical Course  Elderly patient with dementia. Fall at nursing home from wheelchair. SHe has been seen in ED for same on several occasions. Last seen 3 days  ago for same. At baseline mental status. Patient is not on blood thinners. Hematoma noted. Talked with husband who would like for patient to have ct of head performed. Witnessed fall with no indications for lab work. Ct of head and neck without any acute changes. 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 Ct in past have revealed same and appears to be stable. Her family has been instructed to follow up with PCP in past for same. I have given instructions again for follow up. Patient is at baseline per nursing staff. She appears safe for return to SNF. Patient was seen an evaluated by Dr. Rubin PayorPickering who agrees with the above plan. Discharged back to SNF in NAD with stable vs. Strict return precautions given.    Final Clinical Impressions(s) / ED Diagnoses   Final  diagnoses:  Fall, initial encounter  Contusion of other part of head, initial encounter    New Prescriptions New Prescriptions   No medications on file     Rise Mu, Cordelia Poche 12/03/15 2120    Benjiman Core, MD 12/06/15 [phone]

## 2015-12-01 NOTE — ED Notes (Signed)
Per pt daughter, requests no blood be drawn off her due to it be traumatizing. States if everything checks out to send back to care facility

## 2015-12-01 NOTE — ED Notes (Signed)
Patient transported to CT 

## 2015-12-01 NOTE — Discharge Instructions (Signed)
Please follow up with your primary doctor for ct findings as seen on prior ct. Please return to the ED if her symptoms worsen or for any other reason.

## 2015-12-01 NOTE — ED Triage Notes (Signed)
Per EMS. Pt from [facility]. Hx of dementia, oriented per norm (name only). Pt staff pt was sleeping in a wheelchair and fell forward out of the wheelchair, hitting her head on the floor. No abrasions or hematomas noted. No LOC. Not on blood thinners.

## 2015-12-01 NOTE — ED Notes (Signed)
Pt keeps trying to stand up by putting legs between stretcher railings. Pt put unstuck 3x. Put seizure pain on railing which seems to resolve this issue. Pt visible from nurse's station.

## 2015-12-01 NOTE — ED Notes (Signed)
[facility] Expected date: 12/01/15 Expected time: 11:44 AM Means of arrival: Ambulance Comments: Fall, no injury

## 2015-12-14 ENCOUNTER — Emergency Department (HOSPITAL_COMMUNITY): Payer: Medicare Other

## 2015-12-14 ENCOUNTER — Encounter (HOSPITAL_COMMUNITY): Payer: Self-pay | Admitting: Emergency Medicine

## 2015-12-14 ENCOUNTER — Emergency Department (HOSPITAL_COMMUNITY)
Admission: EM | Admit: 2015-12-14 | Discharge: 2015-12-14 | Disposition: A | Discharge status: Home / Self Care | Payer: Medicare Other | Attending: Emergency Medicine | Admitting: Emergency Medicine

## 2015-12-14 DIAGNOSIS — S0990XA Unspecified injury of head, initial encounter: Secondary | ICD-10-CM | POA: Insufficient documentation

## 2015-12-14 DIAGNOSIS — Y92129 Unspecified place in nursing home as the place of occurrence of the external cause: Secondary | ICD-10-CM | POA: Insufficient documentation

## 2015-12-14 DIAGNOSIS — Z7982 Long term (current) use of aspirin: Secondary | ICD-10-CM | POA: Diagnosis not present

## 2015-12-14 DIAGNOSIS — Y939 Activity, unspecified: Secondary | ICD-10-CM | POA: Diagnosis not present

## 2015-12-14 DIAGNOSIS — G309 Alzheimer's disease, unspecified: Secondary | ICD-10-CM | POA: Diagnosis not present

## 2015-12-14 DIAGNOSIS — E119 Type 2 diabetes mellitus without complications: Secondary | ICD-10-CM | POA: Insufficient documentation

## 2015-12-14 DIAGNOSIS — W19XXXA Unspecified fall, initial encounter: Secondary | ICD-10-CM

## 2015-12-14 DIAGNOSIS — Z8673 Personal history of transient ischemic attack (TIA), and cerebral infarction without residual deficits: Secondary | ICD-10-CM | POA: Diagnosis not present

## 2015-12-14 DIAGNOSIS — W228XXA Striking against or struck by other objects, initial encounter: Secondary | ICD-10-CM | POA: Insufficient documentation

## 2015-12-14 DIAGNOSIS — Y999 Unspecified external cause status: Secondary | ICD-10-CM | POA: Insufficient documentation

## 2015-12-14 DIAGNOSIS — I10 Essential (primary) hypertension: Secondary | ICD-10-CM | POA: Insufficient documentation

## 2015-12-14 DIAGNOSIS — F0280 Dementia in other diseases classified elsewhere without behavioral disturbance: Secondary | ICD-10-CM | POA: Insufficient documentation

## 2015-12-14 LAB — CBC WITH DIFFERENTIAL/PLATELET
BASOS ABS: 0 10*3/uL (ref 0.0–0.1)
BASOS PCT: 0 %
EOS ABS: 0.3 10*3/uL (ref 0.0–0.7)
EOS PCT: 5 %
HCT: 28.5 % — ABNORMAL LOW (ref 36.0–46.0)
Hemoglobin: 9.1 g/dL — ABNORMAL LOW (ref 12.0–15.0)
LYMPHS PCT: 29 %
Lymphs Abs: 1.5 10*3/uL (ref 0.7–4.0)
MCH: 27.5 pg (ref 26.0–34.0)
MCHC: 31.9 g/dL (ref 30.0–36.0)
MCV: 86.1 fL (ref 78.0–100.0)
MONO ABS: 0.5 10*3/uL (ref 0.1–1.0)
Monocytes Relative: 9 %
Neutro Abs: 2.9 10*3/uL (ref 1.7–7.7)
Neutrophils Relative %: 57 %
PLATELETS: 212 10*3/uL (ref 150–400)
RBC: 3.31 MIL/uL — ABNORMAL LOW (ref 3.87–5.11)
RDW: 16.4 % — AB (ref 11.5–15.5)
WBC: 5.1 10*3/uL (ref 4.0–10.5)

## 2015-12-14 LAB — COMPREHENSIVE METABOLIC PANEL
ALT: 31 U/L (ref 14–54)
AST: 28 U/L (ref 15–41)
Albumin: 3.6 g/dL (ref 3.5–5.0)
Alkaline Phosphatase: 63 U/L (ref 38–126)
Anion gap: 7 (ref 5–15)
BUN: 28 mg/dL — AB (ref 6–20)
CHLORIDE: 105 mmol/L (ref 101–111)
CO2: 29 mmol/L (ref 22–32)
Calcium: 9 mg/dL (ref 8.9–10.3)
Creatinine, Ser: 1.02 mg/dL — ABNORMAL HIGH (ref 0.44–1.00)
GFR calc Af Amer: 58 mL/min — ABNORMAL LOW (ref >60)
GFR, EST NON AFRICAN AMERICAN: 50 mL/min — AB (ref >60)
Glucose, Bld: 96 mg/dL (ref 65–99)
POTASSIUM: 4.3 mmol/L (ref 3.5–5.1)
Sodium: 141 mmol/L (ref 135–145)
Total Bilirubin: 0.2 mg/dL — ABNORMAL LOW (ref 0.3–1.2)
Total Protein: 6.9 g/dL (ref 6.5–8.1)

## 2015-12-14 LAB — URINALYSIS, ROUTINE W REFLEX MICROSCOPIC
Bilirubin Urine: NEGATIVE
Glucose, UA: NEGATIVE mg/dL
HGB URINE DIPSTICK: NEGATIVE
Ketones, ur: NEGATIVE mg/dL
Leukocytes, UA: NEGATIVE
NITRITE: NEGATIVE
Protein, ur: NEGATIVE mg/dL
SPECIFIC GRAVITY, URINE: 1.009 (ref 1.005–1.030)
pH: 7.5 (ref 5.0–8.0)

## 2015-12-14 LAB — I-STAT CG4 LACTIC ACID, ED: LACTIC ACID, VENOUS: 0.95 mmol/L (ref 0.5–1.9)

## 2015-12-14 MED ORDER — SODIUM CHLORIDE 0.9 % IV BOLUS (SEPSIS)
500.0000 mL | Freq: Once | INTRAVENOUS | Status: AC
  Administered 2015-12-14: 500 mL via INTRAVENOUS

## 2015-12-14 NOTE — Progress Notes (Signed)
[facility]  Pt from [facility] facility Last 10 ED visits for fall, head injury [facility] for [facility] ED CM and SW spoke about this pt Sent email to SW asst director and [place]  NO ED CP  [facility]

## 2015-12-14 NOTE — ED Provider Notes (Signed)
[facility]-EMERGENCY DEPT Provider Note   CSN: [id number] Arrival date & time: 12/14/15  1050     History   Chief Complaint Chief Complaint  Patient presents with  . Fall    HPI Marilyn Mendez is a 82 y.o. female with a past medical history significant for dementia, stroke, diabetes, and hypertension who presents via EMS from [facility] nursing facility for a fall. Patient has dementia and is nonverbal at baseline. Patient does not follow commands at baseline. Nursing facility was called and nursing manager was spoken with. Nursing manager reports that the patient had a witnessed fall before arrival. Patient was sitting in a chair after eating breakfast and was reportedly at her normal baseline mental status. Manager denies any recent fevers, chills, cough, vomiting, or other complaints. Apparently, the manager saw her fall forward hitting her head on the hard ground. She denied loss of consciousness, postictal period, vomiting, or any change in baseline mental status. Manager denies any evidence of seizure-like activity. Manager thinks that the patient might have fallen asleep in her chair and fell down.   Due to a witnessed fall causing the patient hit her head, they sent her to the ED for evaluation. They report that she is acting completely normal at this time.  Level V caveat due to dementia and inability of patient to answer questions.  The history is provided by the EMS personnel, the nursing home and medical records. No language interpreter was used.  Fall  This is a recurrent problem. The current episode started less than 1 hour ago. The problem occurs rarely. The problem has been resolved. Pertinent negatives include no chest pain, no abdominal pain, no headaches and no shortness of breath. Nothing aggravates the symptoms. Nothing relieves the symptoms. She has tried nothing for the symptoms. The treatment provided no relief.    Past Medical History:  Diagnosis Date  .  Alzheimer's dementia   . CVA (cerebral infarction)   . Diabetes mellitus without complication (HCC)   . Hypernatremia   . Hyperosmolality   . Hypertension   . Vitamin D deficiency     There are no active problems to display for this patient.   History reviewed. No pertinent surgical history.  OB History    No data available       Home Medications    Prior to Admission medications   Medication Sig Start Date End Date Taking? Authorizing Provider  acetaminophen (TYLENOL) 500 MG tablet Take 500 mg by mouth every 4 (four) hours as needed for mild pain, fever or headache.     Historical Provider, MD  acetaminophen (TYLENOL) 650 MG CR tablet Take 650 mg by mouth 3 (three) times daily as needed for pain.    Historical Provider, MD  alum & mag hydroxide-simeth (MINTOX) 200-200-20 MG/5ML suspension Take 30 mLs by mouth every 6 (six) hours as needed for indigestion or heartburn.    Historical Provider, MD  aspirin 81 MG chewable tablet Chew 81 mg by mouth every morning.    Historical Provider, MD  Calcium Carb-Cholecalciferol (CALCIUM 500 +D) 500-400 MG-UNIT TABS Take 1 tablet by mouth daily.    Historical Provider, MD  diphenhydrAMINE (BENADRYL) 25 mg capsule Take 25 mg by mouth 2 (two) times daily as needed for itching.    Historical Provider, MD  ferrous sulfate 220 (44 Fe) MG/5ML solution Take 330 mg by mouth at bedtime.    Historical Provider, MD  guaifenesin (ROBITUSSIN) 100 MG/5ML syrup Take 200 mg by mouth  every 6 (six) hours as needed for cough.    Historical Provider, MD  hydrALAZINE (APRESOLINE) 50 MG tablet Take 50 mg by mouth 3 (three) times daily.    Historical Provider, MD  loperamide (IMODIUM) 2 MG capsule Take 2 mg by mouth as needed for diarrhea or loose stools.     Historical Provider, MD  losartan (COZAAR) 25 MG tablet Take 25 mg by mouth every morning.    Historical Provider, MD  magnesium hydroxide (MILK OF MAGNESIA) 400 MG/5ML suspension Take 30 mLs by mouth daily as  needed for mild constipation.    Historical Provider, MD  neomycin-bacitracin-polymyxin (NEOSPORIN) ointment Apply 1 application topically as needed for wound care.     Historical Provider, MD  ondansetron (ZOFRAN) 4 MG tablet Take 4 mg by mouth every 6 (six) hours as needed for nausea or vomiting.    Historical Provider, MD  sodium chloride irrigation 0.9 % irrigation Irrigate with as directed daily as needed (for wound care- clean area with normal saline, apply neosporin or equivalent, and cover with band aid or gauze/tape).    Historical Provider, MD    Family History No family history on file.  Social History Social History  Substance Use Topics  . Smoking status: Never Smoker  . Smokeless tobacco: Never Used  . Alcohol use No     Allergies   Patient has no known allergies.   Review of Systems Review of Systems  Unable to perform ROS: Dementia  Respiratory: Negative for shortness of breath.   Cardiovascular: Negative for chest pain.  Gastrointestinal: Negative for abdominal pain.  Neurological: Negative for headaches.     Physical Exam Updated Vital Signs BP (!) 124/53   Pulse 68   Temp 97.6 F (36.4 C)   Resp 16   SpO2 100%   Physical Exam  Constitutional: She appears well-developed and well-nourished. No distress.  HENT:  Head: Normocephalic and atraumatic.  Right Ear: External ear normal.  Left Ear: External ear normal.  Nose: Nose normal.  Mouth/Throat: Oropharynx is clear and moist. No oropharyngeal exudate.  Eyes: Conjunctivae and EOM are normal. Pupils are equal, round, and reactive to light.  Neck: Normal range of motion. Neck supple.  Cardiovascular: Normal rate, regular rhythm and normal heart sounds.   No murmur heard. Pulmonary/Chest: Effort normal and breath sounds normal. No stridor. No respiratory distress. She has no wheezes. She has no rales. She exhibits no tenderness.  Abdominal: Soft. She exhibits no distension. There is no tenderness.  There is no guarding.  Musculoskeletal: She exhibits no edema or tenderness.  Neurological: She is alert. She has normal strength. She displays no tremor.  Patient is nonverbal. Patient has severe dementia. Patient is moving all extremities and exam room bed.   Patient will follow commands to participate in coordination or sensory exam.  Skin: Skin is warm and dry. Capillary refill takes less than 2 seconds. She is not diaphoretic. No erythema.  Nursing note and vitals reviewed.    ED Treatments / Results  Labs (all labs ordered are listed, but only abnormal results are displayed) Labs Reviewed  CBC WITH DIFFERENTIAL/PLATELET - Abnormal; Notable for the following:       Result Value   RBC 3.31 (*)    Hemoglobin 9.1 (*)    HCT 28.5 (*)    RDW 16.4 (*)    All other components within normal limits  COMPREHENSIVE METABOLIC PANEL - Abnormal; Notable for the following:    BUN 28 (*)  Creatinine, Ser 1.02 (*)    Total Bilirubin 0.2 (*)    GFR calc non Af Amer 50 (*)    GFR calc Af Amer 58 (*)    All other components within normal limits  URINALYSIS, ROUTINE W REFLEX MICROSCOPIC (NOT AT [facility])  I-STAT CG4 LACTIC ACID, ED    EKG  EKG Interpretation  Date/Time:  Thursday December 14 2015 11:45:38 EST Ventricular Rate:  57 PR Interval:    QRS Duration: 80 QT Interval:  441 QTC Calculation: 430 R Axis:   59 Text Interpretation:  Sinus rhythm Nonspecific T abnrm, anterolateral leads Baseline wander in lead(s) III aVL No STEMI Confirmed by Rush LandmarkEGELER MD, Traxton Kolenda [phone]([id number]) on 12/14/2015 5:48:44 PM       Radiology Dg Chest 2 View  Result Date: 12/14/2015 CLINICAL DATA:  Fall.  Altered mental status. EXAM: CHEST  2 VIEW COMPARISON:  08/25/2015 FINDINGS: The heart is moderately enlarged. Lungs are hyperaerated. Chronic pleural thickening at the right apex is stable. Bony changes in the first ribs are stable. No pneumothorax. No pleural effusion. Normal vascularity. IMPRESSION: No  active cardiopulmonary disease. Electronically Signed   By: Jolaine ClickArthur  Hoss M.D.   On: 12/14/2015 13:34   Ct Head Wo Contrast  Result Date: 12/14/2015 CLINICAL DATA:  Status post fall from a wheelchair today. EXAM: CT HEAD WITHOUT CONTRAST CT CERVICAL SPINE WITHOUT CONTRAST TECHNIQUE: Multidetector CT imaging of the head and cervical spine was performed following the standard protocol without intravenous contrast. Multiplanar CT image reconstructions of the cervical spine were also generated. COMPARISON:  Head and cervical spine CT scans 12/01/2015. Head CT scan 07/11/2015. FINDINGS: CT HEAD FINDINGS Brain: Marked dilatation of the ventricular system is again seen. Extensive hypoattenuation is present in the deep white matter structures. No evidence of acute intracranial abnormality including hemorrhage, infarct, mass lesion, mass effect, midline shift or abnormal extra-axial fluid collection. No pneumocephalus. Vascular: Atherosclerosis noted. Skull: Intact. Sinuses/Orbits: Unremarkable. Other: None. CT CERVICAL SPINE FINDINGS Alignment: Straightening of lordosis is unchanged.  No listhesis. Skull base and vertebrae: No acute fracture. No primary bone lesion or focal pathologic process. Soft tissues and spinal canal: No prevertebral fluid or swelling. No visible canal hematoma. Disc levels: Multilevel loss of disc space height and facet arthropathy are unchanged. Upper chest: Negative. Other: None. IMPRESSION: No acute abnormality head or cervical spine. Marked dilatation of the ventricular system with an appearance compatible with normal pressure hydrocephalus. These findings unchanged compared the prior studies. Cervical spondylosis. Electronically Signed   By: Drusilla Kannerhomas  Dalessio M.D.   On: 12/14/2015 12:19   Ct Cervical Spine Wo Contrast  Result Date: 12/14/2015 CLINICAL DATA:  Status post fall from a wheelchair today. EXAM: CT HEAD WITHOUT CONTRAST CT CERVICAL SPINE WITHOUT CONTRAST TECHNIQUE: Multidetector  CT imaging of the head and cervical spine was performed following the standard protocol without intravenous contrast. Multiplanar CT image reconstructions of the cervical spine were also generated. COMPARISON:  Head and cervical spine CT scans 12/01/2015. Head CT scan 07/11/2015. FINDINGS: CT HEAD FINDINGS Brain: Marked dilatation of the ventricular system is again seen. Extensive hypoattenuation is present in the deep white matter structures. No evidence of acute intracranial abnormality including hemorrhage, infarct, mass lesion, mass effect, midline shift or abnormal extra-axial fluid collection. No pneumocephalus. Vascular: Atherosclerosis noted. Skull: Intact. Sinuses/Orbits: Unremarkable. Other: None. CT CERVICAL SPINE FINDINGS Alignment: Straightening of lordosis is unchanged.  No listhesis. Skull base and vertebrae: No acute fracture. No primary bone lesion or focal pathologic process. Soft tissues and  spinal canal: No prevertebral fluid or swelling. No visible canal hematoma. Disc levels: Multilevel loss of disc space height and facet arthropathy are unchanged. Upper chest: Negative. Other: None. IMPRESSION: No acute abnormality head or cervical spine. Marked dilatation of the ventricular system with an appearance compatible with normal pressure hydrocephalus. These findings unchanged compared the prior studies. Cervical spondylosis. Electronically Signed   By: Drusilla Kannerhomas  Dalessio M.D.   On: 12/14/2015 12:19    Procedures Procedures (including critical care time)  Medications Ordered in ED Medications  sodium chloride 0.9 % bolus 500 mL (0 mLs Intravenous Stopped 12/14/15 1345)     Initial Impression / Assessment and Plan / ED Course  I have reviewed the triage vital signs and the nursing notes.  Pertinent labs & imaging results that were available during my care of the patient were reviewed by me and considered in my medical decision making (see chart for details).  Clinical Course      Marilyn Mendez is a 82 y.o. female with a past medical history significant for dementia, stroke, diabetes, and hypertension who [facility].  History and exam are seen above.   on exam, patient has no evidence of external trauma. No ecchymosis, bruising, or tenderness in head, neck, extremities, chest, or abdomen. Patient does not follow commands but is smiling and attentive. Normal lung exam. Patient patient moved all extremities but has some hypertonicity of her lower extremities.   given patient's inability to describe symptoms, patient will have imaging to look at her head, neck, and chest. Patient will have laboratory testing to look for occult infection.   If patient's workup is negative, anticipate discharge.  Patient's diagnostic workup results are seen above. Lab testing showed no evidence of UTI, normal lactic acid, unremarkable CMP, and CBC showing no leukocytosis and hemoglobin is similar to prior. Esther x-ray showed no evidence of pneumonia or dramatic injury and CT of the head appears unchanged from prior with no traumatic injuries visible.  Given negative workup for new injuries and reassuring story from facility, feel patient is stable for discharge back to the facility. Suspect patient fell asleep in her chair causing her to fall forward hitting her head. Patient is not having any complaints and is resting calmly.  Patient will be discharged back to facility.    Final Clinical Impressions(s) / ED Diagnoses   Final diagnoses:  Fall, initial encounter    New Prescriptions New Prescriptions   No medications on file    Clinical Impression: 1. Fall, initial encounter     Disposition: Discharge  Condition: Good  I have discussed the results, Dx and Tx plan with the pt(& family if present). He/she/they expressed understanding and agree(s) with the plan. Discharge instructions discussed at great length.  Strict return precautions discussed and pt &/or family have verbalized understanding of the instructions. No further questions at time of discharge.    New Prescriptions   No medications on file    Follow Up: Florentina JennyHenry Tripp, MD [phone] [address] [place] [id number] [phone]        Canary Brimhristopher J Kerrin Markman, MD 12/14/15 [phone]

## 2015-12-14 NOTE — ED Notes (Signed)
Bed: [id number] Expected date:  Expected time:  Means of arrival:  Comments: EMS- 83yo F, fall/c collar

## 2015-12-14 NOTE — Progress Notes (Signed)
Staffed with Nurse.  Attempted to speak with patient or see if any family was present. No one was in the room at the time.  Posey ReaLaVonia Cora Brierley, LCSWA Clinical Social Worker [phone] 12:03 PM

## 2015-12-14 NOTE — ED Notes (Signed)
[facility]

## 2015-12-14 NOTE — ED Triage Notes (Signed)
[facility] brought in by EMS for witnessed fall out of wheelchair.  Patient has dementia and at her baseline per staff at facility.  Staff also reported to EMS that Patient does not follow commands.  Patient had no LOC nor on blood thinners. Patient has no apparent injuries that are visible.

## 2015-12-14 NOTE — ED Notes (Signed)
Patient returned from CT

## 2016-01-09 ENCOUNTER — Encounter (HOSPITAL_COMMUNITY): Payer: Self-pay | Admitting: *Deleted

## 2016-01-09 ENCOUNTER — Emergency Department (HOSPITAL_COMMUNITY)
Admission: EM | Admit: 2016-01-09 | Discharge: 2016-01-09 | Disposition: A | Discharge status: Home / Self Care | Payer: Medicare Other | Attending: Emergency Medicine | Admitting: Emergency Medicine

## 2016-01-09 DIAGNOSIS — Z7982 Long term (current) use of aspirin: Secondary | ICD-10-CM | POA: Insufficient documentation

## 2016-01-09 DIAGNOSIS — Y999 Unspecified external cause status: Secondary | ICD-10-CM | POA: Insufficient documentation

## 2016-01-09 DIAGNOSIS — Y939 Activity, unspecified: Secondary | ICD-10-CM | POA: Insufficient documentation

## 2016-01-09 DIAGNOSIS — F0280 Dementia in other diseases classified elsewhere without behavioral disturbance: Secondary | ICD-10-CM | POA: Insufficient documentation

## 2016-01-09 DIAGNOSIS — I10 Essential (primary) hypertension: Secondary | ICD-10-CM | POA: Diagnosis not present

## 2016-01-09 DIAGNOSIS — E119 Type 2 diabetes mellitus without complications: Secondary | ICD-10-CM | POA: Diagnosis not present

## 2016-01-09 DIAGNOSIS — Z043 Encounter for examination and observation following other accident: Secondary | ICD-10-CM | POA: Insufficient documentation

## 2016-01-09 DIAGNOSIS — G309 Alzheimer's disease, unspecified: Secondary | ICD-10-CM | POA: Diagnosis not present

## 2016-01-09 DIAGNOSIS — W07XXXA Fall from chair, initial encounter: Secondary | ICD-10-CM | POA: Diagnosis not present

## 2016-01-09 DIAGNOSIS — Y929 Unspecified place or not applicable: Secondary | ICD-10-CM | POA: Insufficient documentation

## 2016-01-09 DIAGNOSIS — W19XXXA Unspecified fall, initial encounter: Secondary | ICD-10-CM

## 2016-01-09 NOTE — ED Notes (Signed)
Bed: WHALA Expected date:  Expected time:  Means of arrival:  Comments: 

## 2016-01-09 NOTE — ED Notes (Signed)
[facility] called for transport.  

## 2016-01-09 NOTE — ED Notes (Signed)
Pt able to ambulate with a steady gait and no issues.

## 2016-01-09 NOTE — ED Notes (Signed)
Pt was seen being taken out by PTAR.  NAD noted.

## 2016-01-09 NOTE — Progress Notes (Signed)
Pt with [facility] 11 ED visits, no admissions Pt from [facility] facility Pt with witnessed fall  Pt with [facility]IC noted falls 1-3 times a month since May 2017  Pcp henry tripp No ED CP and no availability [facility]  Pt [facility] by CM [facility] fidgeting with siderails

## 2016-01-09 NOTE — ED Provider Notes (Signed)
[facility]-EMERGENCY DEPT Provider Note   CSN: [id number] Arrival date & time: 01/09/16  1221     History   Chief Complaint Chief Complaint  Patient presents with  . Fall    HPI Marilyn Mendez is a 82 y.o. female.  Pt presents to the ED today via EMS for a fall.  Pt is here frequently for falls.  She has dementia and is unable to contribute to the history.      Past Medical History:  Diagnosis Date  . Alzheimer's dementia   . CVA (cerebral infarction)   . Diabetes mellitus without complication (HCC)   . Hypernatremia   . Hyperosmolality   . Hypertension   . Vitamin D deficiency     There are no active problems to display for this patient.   History reviewed. No pertinent surgical history.  OB History    No data available       Home Medications    Prior to Admission medications   Medication Sig Start Date End Date Taking? Authorizing Provider  acetaminophen (TYLENOL) 500 MG tablet Take 500 mg by mouth every 4 (four) hours as needed for mild pain, fever or headache.     Historical Provider, MD  acetaminophen (TYLENOL) 650 MG CR tablet Take 650 mg by mouth 3 (three) times daily as needed for pain.    Historical Provider, MD  alum & mag hydroxide-simeth (MINTOX) 200-200-20 MG/5ML suspension Take 30 mLs by mouth as needed for indigestion or heartburn.     Historical Provider, MD  aspirin 81 MG chewable tablet Chew 81 mg by mouth every morning.    Historical Provider, MD  Calcium Carb-Cholecalciferol (CALCIUM 500 +D) 500-400 MG-UNIT TABS Take 1 tablet by mouth daily with breakfast.     Historical Provider, MD  diphenhydrAMINE (BENADRYL) 25 mg capsule Take 25 mg by mouth 2 (two) times daily as needed for itching.    Historical Provider, MD  ferrous sulfate 220 (44 Fe) MG/5ML solution Take 330 mg by mouth at bedtime.    Historical Provider, MD  guaifenesin (ROBITUSSIN) 100 MG/5ML syrup Take 200 mg by mouth every 6 (six) hours as needed for cough.    Historical Provider,  MD  hydrALAZINE (APRESOLINE) 50 MG tablet Take 50 mg by mouth 3 (three) times daily.    Historical Provider, MD  loperamide (IMODIUM) 2 MG capsule Take 2 mg by mouth as needed for diarrhea or loose stools.     Historical Provider, MD  losartan (COZAAR) 25 MG tablet Take 25 mg by mouth every morning.    Historical Provider, MD  magnesium hydroxide (MILK OF MAGNESIA) 400 MG/5ML suspension Take 30 mLs by mouth at bedtime as needed for mild constipation.     Historical Provider, MD  neomycin-bacitracin-polymyxin (NEOSPORIN) ointment Apply 1 application topically as needed for wound care.     Historical Provider, MD  Nutritional Supplements (NUTRITIONAL DRINK) LIQD Take 1 Can by mouth 3 (three) times daily. *Mighty Shakes*    Historical Provider, MD  ondansetron (ZOFRAN) 4 MG tablet Take 4 mg by mouth every 6 (six) hours as needed for nausea or vomiting.    Historical Provider, MD    Family History No family history on file.  Social History Social History  Substance Use Topics  . Smoking status: Never Smoker  . Smokeless tobacco: Never Used  . Alcohol use No     Allergies   Patient has no known allergies.   Review of Systems Review of Systems  Unable to  perform ROS: Dementia  All other systems reviewed and are negative.    Physical Exam Updated Vital Signs BP 122/76 (BP Location: Left Arm)   Pulse (!) 57   Temp 98 F (36.7 C) (Oral)   Resp 17   SpO2 100%   Physical Exam  Constitutional: She appears well-developed and well-nourished.  HENT:  Head: Normocephalic and atraumatic.  Right Ear: External ear normal.  Left Ear: External ear normal.  Nose: Nose normal.  Mouth/Throat: Oropharynx is clear and moist.  Eyes: Conjunctivae and EOM are normal. Pupils are equal, round, and reactive to light.  Neck: Normal range of motion. Neck supple.  Cardiovascular: Normal rate, regular rhythm, normal heart sounds and intact distal pulses.   Pulmonary/Chest: Effort normal and breath  sounds normal.  Abdominal: Soft. Bowel sounds are normal.  Musculoskeletal: Normal range of motion.  Neurological: She is alert.  Skin: Skin is warm.  Psychiatric: She has a normal mood and affect. Her behavior is normal. Judgment and thought content normal.  Nursing note and vitals reviewed.    ED Treatments / Results  Labs (all labs ordered are listed, but only abnormal results are displayed) Labs Reviewed - No data to display  EKG  EKG Interpretation None       Radiology No results found.  Procedures Procedures (including critical care time)  Medications Ordered in ED Medications - No data to display   Initial Impression / Assessment and Plan / ED Course  I have reviewed the triage vital signs and the nursing notes.  Pertinent labs & imaging results that were available during my care of the patient were reviewed by me and considered in my medical decision making (see chart for details).  Clinical Course    Pt is awake and alert.  She is ambulatory.  There is no sign of injury.  Pt ok for d/c.  Final Clinical Impressions(s) / ED Diagnoses   Final diagnoses:  Fall, initial encounter    New Prescriptions New Prescriptions   No medications on file     Jacalyn LefevreJulie Tamela Elsayed, MD 01/09/16 1406

## 2016-01-09 NOTE — ED Triage Notes (Signed)
Per EMS report: [facility] and presents after pt fell from a chair.  Fall was witnessed by staff and they reported to EMS that pt did hit her head.  EMS did not note any obvious injuries and pt only states she is dizzy.  Hx dementia and pt is at her baseline which is a/o x 2.  EMS placed c-collar on pt since she has dementia.

## 2016-02-27 ENCOUNTER — Emergency Department (HOSPITAL_COMMUNITY): Payer: Medicare Other

## 2016-02-27 ENCOUNTER — Encounter (HOSPITAL_COMMUNITY): Payer: Self-pay

## 2016-02-27 ENCOUNTER — Emergency Department (HOSPITAL_COMMUNITY)
Admission: EM | Admit: 2016-02-27 | Discharge: 2016-02-27 | Disposition: A | Discharge status: Home / Self Care | Payer: Medicare Other | Attending: Emergency Medicine | Admitting: Emergency Medicine

## 2016-02-27 DIAGNOSIS — G309 Alzheimer's disease, unspecified: Secondary | ICD-10-CM | POA: Diagnosis not present

## 2016-02-27 DIAGNOSIS — F0280 Dementia in other diseases classified elsewhere without behavioral disturbance: Secondary | ICD-10-CM | POA: Diagnosis not present

## 2016-02-27 DIAGNOSIS — W07XXXA Fall from chair, initial encounter: Secondary | ICD-10-CM | POA: Diagnosis not present

## 2016-02-27 DIAGNOSIS — Y939 Activity, unspecified: Secondary | ICD-10-CM | POA: Diagnosis not present

## 2016-02-27 DIAGNOSIS — I10 Essential (primary) hypertension: Secondary | ICD-10-CM | POA: Insufficient documentation

## 2016-02-27 DIAGNOSIS — Y92129 Unspecified place in nursing home as the place of occurrence of the external cause: Secondary | ICD-10-CM | POA: Diagnosis not present

## 2016-02-27 DIAGNOSIS — Z8673 Personal history of transient ischemic attack (TIA), and cerebral infarction without residual deficits: Secondary | ICD-10-CM | POA: Diagnosis not present

## 2016-02-27 DIAGNOSIS — Z7982 Long term (current) use of aspirin: Secondary | ICD-10-CM | POA: Insufficient documentation

## 2016-02-27 DIAGNOSIS — W19XXXA Unspecified fall, initial encounter: Secondary | ICD-10-CM

## 2016-02-27 DIAGNOSIS — S0993XA Unspecified injury of face, initial encounter: Secondary | ICD-10-CM | POA: Diagnosis present

## 2016-02-27 DIAGNOSIS — Y999 Unspecified external cause status: Secondary | ICD-10-CM | POA: Diagnosis not present

## 2016-02-27 DIAGNOSIS — S0083XA Contusion of other part of head, initial encounter: Secondary | ICD-10-CM | POA: Insufficient documentation

## 2016-02-27 DIAGNOSIS — E119 Type 2 diabetes mellitus without complications: Secondary | ICD-10-CM | POA: Insufficient documentation

## 2016-02-27 NOTE — ED Triage Notes (Addendum)
PER EMS: [facility], sent here for a witnessed fall. Pt was sitting in chair, leaned over to her side and fell out of the chair. EMS reports "she just leans" when sitting in chairs and has hx of multiple falls. No LOC, no syncope. Hx of dementia and is non-verbal and mentation is at baseline per staff from nursing home. Hematoma to RIGHT side of head, no bleeding and no laceration. Does not take any blood thinners. VS: BP-127/69, HR-66, RR-16, 100% RA, CBG-91.

## 2016-02-27 NOTE — ED Notes (Signed)
Bed: WHALB Expected date:  Expected time:  Means of arrival:  Comments: EMS fall 

## 2016-02-27 NOTE — ED Provider Notes (Signed)
[facility]-EMERGENCY DEPT Provider Note   CSN: [id number] Arrival date & time: 02/27/16  1710     History   Chief Complaint Chief Complaint  Patient presents with  . Fall    HPI Marilyn Mendez is a 83 y.o. female.  The history is provided by the patient. No language interpreter was used.  Fall  This is a new problem. She has tried nothing for the symptoms.  Pt is reported by EMS to have fallen asleep and fell out of her chair.  Pt hit her head and has a knot on her head.   Past Medical History:  Diagnosis Date  . Alzheimer's dementia   . CVA (cerebral infarction)   . Diabetes mellitus without complication (HCC)   . Hypernatremia   . Hyperosmolality   . Hypertension   . Vitamin D deficiency     There are no active problems to display for this patient.   History reviewed. No pertinent surgical history.  OB History    No data available       Home Medications    Prior to Admission medications   Medication Sig Start Date End Date Taking? Authorizing Provider  acetaminophen (TYLENOL) 500 MG tablet Take 500 mg by mouth every 4 (four) hours as needed for mild pain, fever or headache.     Historical Provider, MD  acetaminophen (TYLENOL) 650 MG CR tablet Take 650 mg by mouth 3 (three) times daily as needed for pain.    Historical Provider, MD  alum & mag hydroxide-simeth (MINTOX) 200-200-20 MG/5ML suspension Take 30 mLs by mouth as needed for indigestion or heartburn.     Historical Provider, MD  aspirin 81 MG chewable tablet Chew 81 mg by mouth every morning.    Historical Provider, MD  Calcium Carb-Cholecalciferol (CALCIUM 500 +D) 500-400 MG-UNIT TABS Take 1 tablet by mouth daily with breakfast.     Historical Provider, MD  diphenhydrAMINE (BENADRYL) 25 mg capsule Take 25 mg by mouth 2 (two) times daily as needed for itching.    Historical Provider, MD  ferrous sulfate 220 (44 Fe) MG/5ML solution Take 330 mg by mouth at bedtime.    Historical Provider, MD  guaifenesin  (ROBITUSSIN) 100 MG/5ML syrup Take 200 mg by mouth every 6 (six) hours as needed for cough.    Historical Provider, MD  hydrALAZINE (APRESOLINE) 50 MG tablet Take 50 mg by mouth 3 (three) times daily.    Historical Provider, MD  loperamide (IMODIUM) 2 MG capsule Take 2 mg by mouth as needed for diarrhea or loose stools.     Historical Provider, MD  losartan (COZAAR) 25 MG tablet Take 25 mg by mouth every morning.    Historical Provider, MD  magnesium hydroxide (MILK OF MAGNESIA) 400 MG/5ML suspension Take 30 mLs by mouth at bedtime as needed for mild constipation.     Historical Provider, MD  neomycin-bacitracin-polymyxin (NEOSPORIN) ointment Apply 1 application topically as needed for wound care.     Historical Provider, MD  Nutritional Supplements (NUTRITIONAL DRINK) LIQD Take 1 Can by mouth 3 (three) times daily. *Mighty Shakes*    Historical Provider, MD  ondansetron (ZOFRAN) 4 MG tablet Take 4 mg by mouth every 6 (six) hours as needed for nausea or vomiting.    Historical Provider, MD    Family History No family history on file.  Social History Social History  Substance Use Topics  . Smoking status: Never Smoker  . Smokeless tobacco: Never Used  . Alcohol use No  Allergies   Patient has no known allergies.   Review of Systems Review of Systems  All other systems reviewed and are negative.    Physical Exam Updated Vital Signs BP 116/60 (BP Location: Left Arm)   Pulse 66   Temp 98.4 F (36.9 C) (Oral)   Resp 18   SpO2 96%   Physical Exam  Constitutional: She appears well-developed and well-nourished. No distress.  HENT:  Head: Normocephalic.  Swollen area right scalp,  nontender   Eyes: Conjunctivae are normal.  Neck: Neck supple.  Cardiovascular: Normal rate and regular rhythm.   No murmur heard. Pulmonary/Chest: Effort normal and breath sounds normal. No respiratory distress.  Abdominal: Soft. There is no tenderness.  Musculoskeletal: Normal range of  motion. She exhibits no edema.  Neurological: She is alert.  Skin: Skin is warm and dry.  Psychiatric:  Pt nonverbal   Nursing note and vitals reviewed.    ED Treatments / Results  Labs (all labs ordered are listed, but only abnormal results are displayed) Labs Reviewed - No data to display  EKG  EKG Interpretation None       Radiology Ct Head Wo Contrast  Result Date: 02/27/2016 CLINICAL DATA:  Status post fall out of chair. Right-sided scalp hematoma. Initial encounter. EXAM: CT HEAD WITHOUT CONTRAST CT CERVICAL SPINE WITHOUT CONTRAST TECHNIQUE: Multidetector CT imaging of the head and cervical spine was performed following the standard protocol without intravenous contrast. Multiplanar CT image reconstructions of the cervical spine were also generated. COMPARISON:  CT of the head and cervical spine performed 12/14/2015 FINDINGS: CT HEAD FINDINGS Brain: No evidence of acute infarction, hemorrhage, hydrocephalus, extra-axial collection or mass lesion/mass effect. Prominence of the ventricles and sulci reflects moderate cortical volume loss. Diffuse periventricular subcortical white matter change likely reflects small vessel ischemic microangiopathy. Mild cerebellar atrophy is noted. A small chronic infarct is noted at the high left parietal lobe, with associated encephalomalacia. The brainstem and fourth ventricle are within normal limits. The basal ganglia are unremarkable in appearance. No mass effect or midline shift is seen. Vascular: No hyperdense vessel or unexpected calcification. Skull: There is no evidence of fracture; visualized osseous structures are unremarkable in appearance. Sinuses/Orbits: The visualized portions of the orbits are within normal limits. The paranasal sinuses and mastoid air cells are well-aerated. Other: Mild soft tissue swelling is noted overlying the right parietal calvarium. CT CERVICAL SPINE FINDINGS Alignment: Normal. Skull base and vertebrae: No acute  fracture. No primary bone lesion or focal pathologic process. Soft tissues and spinal canal: No prevertebral fluid or swelling. No visible canal hematoma. Disc levels: Multilevel disc space narrowing is noted along the cervical spine, with scattered anterior and posterior disc osteophyte complexes. Upper chest: A vague 1.3 cm hypodensity at the left thyroid lobe is likely benign, given its size. The visualized lung apices are clear. Other: No additional soft tissue abnormalities are seen. IMPRESSION: 1. No evidence of traumatic intracranial injury or fracture. 2. No evidence of fracture or subluxation along the cervical spine. 3. Mild soft tissue swelling overlying the right parietal calvarium. 4. Moderate cortical volume loss and diffuse small vessel ischemic microangiopathy. 5. Small chronic infarct at the high left parietal lobe, with associated encephalomalacia. 6. Mild degenerative change along the cervical spine. Electronically Signed   By: Roanna Raider M.D.   On: 02/27/2016 18:28   Ct Cervical Spine Wo Contrast  Result Date: 02/27/2016 CLINICAL DATA:  Status post fall out of chair. Right-sided scalp hematoma. Initial encounter. EXAM: CT  HEAD WITHOUT CONTRAST CT CERVICAL SPINE WITHOUT CONTRAST TECHNIQUE: Multidetector CT imaging of the head and cervical spine was performed following the standard protocol without intravenous contrast. Multiplanar CT image reconstructions of the cervical spine were also generated. COMPARISON:  CT of the head and cervical spine performed 12/14/2015 FINDINGS: CT HEAD FINDINGS Brain: No evidence of acute infarction, hemorrhage, hydrocephalus, extra-axial collection or mass lesion/mass effect. Prominence of the ventricles and sulci reflects moderate cortical volume loss. Diffuse periventricular subcortical white matter change likely reflects small vessel ischemic microangiopathy. Mild cerebellar atrophy is noted. A small chronic infarct is noted at the high left parietal lobe,  with associated encephalomalacia. The brainstem and fourth ventricle are within normal limits. The basal ganglia are unremarkable in appearance. No mass effect or midline shift is seen. Vascular: No hyperdense vessel or unexpected calcification. Skull: There is no evidence of fracture; visualized osseous structures are unremarkable in appearance. Sinuses/Orbits: The visualized portions of the orbits are within normal limits. The paranasal sinuses and mastoid air cells are well-aerated. Other: Mild soft tissue swelling is noted overlying the right parietal calvarium. CT CERVICAL SPINE FINDINGS Alignment: Normal. Skull base and vertebrae: No acute fracture. No primary bone lesion or focal pathologic process. Soft tissues and spinal canal: No prevertebral fluid or swelling. No visible canal hematoma. Disc levels: Multilevel disc space narrowing is noted along the cervical spine, with scattered anterior and posterior disc osteophyte complexes. Upper chest: A vague 1.3 cm hypodensity at the left thyroid lobe is likely benign, given its size. The visualized lung apices are clear. Other: No additional soft tissue abnormalities are seen. IMPRESSION: 1. No evidence of traumatic intracranial injury or fracture. 2. No evidence of fracture or subluxation along the cervical spine. 3. Mild soft tissue swelling overlying the right parietal calvarium. 4. Moderate cortical volume loss and diffuse small vessel ischemic microangiopathy. 5. Small chronic infarct at the high left parietal lobe, with associated encephalomalacia. 6. Mild degenerative change along the cervical spine. Electronically Signed   By: Roanna RaiderJeffery  Chang M.D.   On: 02/27/2016 18:28    Procedures Procedures (including critical care time)  Medications Ordered in ED Medications - No data to display   Initial Impression / Assessment and Plan / ED Course  I have reviewed the triage vital signs and the nursing notes.  Pertinent labs & imaging results that were  available during my care of the patient were reviewed by me and considered in my medical decision making (see chart for details).     On reevaluation.   Pt's daughter at bedside,  She reports pt is at her baseline.  Pt is normally nonverbal An After Visit Summary was printed and given to the patient.  Final Clinical Impressions(s) / ED Diagnoses   Final diagnoses:  Contusion of face, initial encounter  Fall, initial encounter    New Prescriptions New Prescriptions   No medications on file     Elson AreasLeslie K Iris Hairston, PA-C 02/27/16 2012    Shaune Pollackameron Isaacs, MD 02/29/16 1155

## 2016-02-27 NOTE — ED Notes (Signed)
[facility] Will call back.

## 2016-02-27 NOTE — ED Notes (Signed)
Attwempted to call [facility] again.  [facility] is taking pt back to facility

## 2016-11-10 ENCOUNTER — Emergency Department (HOSPITAL_COMMUNITY): Payer: Medicare Other

## 2016-11-10 ENCOUNTER — Emergency Department (HOSPITAL_COMMUNITY)
Admission: EM | Admit: 2016-11-10 | Discharge: 2016-11-10 | Disposition: A | Discharge status: Skilled Nursing Facility | Payer: Medicare Other | Attending: Emergency Medicine | Admitting: Emergency Medicine

## 2016-11-10 DIAGNOSIS — Y92129 Unspecified place in nursing home as the place of occurrence of the external cause: Secondary | ICD-10-CM | POA: Diagnosis not present

## 2016-11-10 DIAGNOSIS — Z79899 Other long term (current) drug therapy: Secondary | ICD-10-CM | POA: Insufficient documentation

## 2016-11-10 DIAGNOSIS — Y939 Activity, unspecified: Secondary | ICD-10-CM | POA: Diagnosis not present

## 2016-11-10 DIAGNOSIS — R51 Headache: Secondary | ICD-10-CM | POA: Insufficient documentation

## 2016-11-10 DIAGNOSIS — E119 Type 2 diabetes mellitus without complications: Secondary | ICD-10-CM | POA: Diagnosis not present

## 2016-11-10 DIAGNOSIS — I10 Essential (primary) hypertension: Secondary | ICD-10-CM | POA: Diagnosis not present

## 2016-11-10 DIAGNOSIS — Y999 Unspecified external cause status: Secondary | ICD-10-CM | POA: Diagnosis not present

## 2016-11-10 DIAGNOSIS — Z7982 Long term (current) use of aspirin: Secondary | ICD-10-CM | POA: Insufficient documentation

## 2016-11-10 DIAGNOSIS — W19XXXA Unspecified fall, initial encounter: Secondary | ICD-10-CM | POA: Insufficient documentation

## 2016-11-10 DIAGNOSIS — G309 Alzheimer's disease, unspecified: Secondary | ICD-10-CM | POA: Diagnosis not present

## 2016-11-10 NOTE — ED Provider Notes (Signed)
[facility]-EMERGENCY DEPT Provider Note   CSN: [id number] Arrival date & time: 11/10/16  1635     History   Chief Complaint Chief Complaint  Patient presents with  . Fall    HPI Marilyn Mendez is a 83 y.o. female hx of dementia, DM, Hypertension here presenting with fall. Patient is from a nursing home and unwitnessed fall. Patient is nonverbal at baseline apparently was holding her head. EMS was called and vitals were stable and CBG was 130. Patient is nonverbal at baseline.   The history is provided by the EMS personnel.   Level V caveat- dementia   Past Medical History:  Diagnosis Date  . Alzheimer's dementia   . CVA (cerebral infarction)   . Diabetes mellitus without complication (HCC)   . Hypernatremia   . Hyperosmolality   . Hypertension   . Vitamin D deficiency     There are no active problems to display for this patient.   No past surgical history on file.  OB History    No data available       Home Medications    Prior to Admission medications   Medication Sig Start Date End Date Taking? Authorizing Provider  acetaminophen (TYLENOL) 500 MG tablet Take 500 mg by mouth every 4 (four) hours as needed for mild pain, fever or headache.     [provider]  acetaminophen (TYLENOL) 650 MG CR tablet Take 650 mg by mouth 3 (three) times daily as needed for pain.    [provider]  alum & mag hydroxide-simeth (MINTOX) 200-200-20 MG/5ML suspension Take 30 mLs by mouth as needed for indigestion or heartburn.     [provider]  aspirin 81 MG chewable tablet Chew 81 mg by mouth every morning.    [provider]  Calcium Carb-Cholecalciferol (CALCIUM 500 +D) 500-400 MG-UNIT TABS Take 1 tablet by mouth daily with breakfast.     [provider]  diphenhydrAMINE (BENADRYL) 25 mg capsule Take 25 mg by mouth 2 (two) times daily as needed for itching.    [provider]  ferrous sulfate 220 (44 Fe) MG/5ML solution Take  330 mg by mouth at bedtime.    [provider]  guaifenesin (ROBITUSSIN) 100 MG/5ML syrup Take 200 mg by mouth every 6 (six) hours as needed for cough.    [provider]  hydrALAZINE (APRESOLINE) 50 MG tablet Take 50 mg by mouth 3 (three) times daily.    [provider]  loperamide (IMODIUM) 2 MG capsule Take 2 mg by mouth as needed for diarrhea or loose stools.     [provider]  losartan (COZAAR) 25 MG tablet Take 25 mg by mouth every morning.    [provider]  magnesium hydroxide (MILK OF MAGNESIA) 400 MG/5ML suspension Take 30 mLs by mouth at bedtime as needed for mild constipation.     [provider]  neomycin-bacitracin-polymyxin (NEOSPORIN) ointment Apply 1 application topically as needed for wound care.     [provider]  Nutritional Supplements (NUTRITIONAL DRINK) LIQD Take 1 Can by mouth 3 (three) times daily. *Mighty Shakes*    [provider]  ondansetron (ZOFRAN) 4 MG tablet Take 4 mg by mouth every 6 (six) hours as needed for nausea or vomiting.    [provider]    Family History No family history on file.  Social History Social History  Substance Use Topics  . Smoking status: Never Smoker  . Smokeless tobacco: Never Used  .  Alcohol use No     Allergies   Patient has no known allergies.   Review of Systems Review of Systems  Neurological: Positive for headaches.  All other systems reviewed and are negative.    Physical Exam Updated Vital Signs BP 109/77 (BP Location: Right Arm)   Pulse 77   Resp 16   SpO2 100%   Physical Exam  Constitutional: She appears well-developed.  HENT:  Head: Normocephalic.  Mouth/Throat: Oropharynx is clear and moist.  No obvious scalp hematoma   Eyes: Pupils are equal, round, and reactive to light. Conjunctivae and EOM are normal.  Neck:  Towel roll around the neck   Cardiovascular: Normal rate, regular rhythm and normal heart sounds.     Pulmonary/Chest: Effort normal and breath sounds normal. No respiratory distress. She has no wheezes. She has no rales.  Abdominal: Soft. Bowel sounds are normal. She exhibits no distension. There is no tenderness. There is no guarding.  Musculoskeletal: Normal range of motion.  No obvious spinal tenderness, dec ROM R hip   Neurological: She is alert.  Demented. Moving all extremities   Skin: Skin is warm.  Psychiatric:  Demented   Nursing note and vitals reviewed.    ED Treatments / Results  Labs (all labs ordered are listed, but only abnormal results are displayed) Labs Reviewed - No data to display  EKG  EKG Interpretation None       Radiology Dg Chest 2 View  Result Date: 11/10/2016 CLINICAL DATA:  83 year old female with history of unwitnessed fall. EXAM: CHEST  2 VIEW COMPARISON:  Chest x-ray 12/14/2015. FINDINGS: Lung volumes are low. No consolidative airspace disease. No pleural effusions. No pneumothorax. No pulmonary nodule or mass noted. Pulmonary vasculature and the cardiomediastinal silhouette are within normal limits. Aortic atherosclerosis. IMPRESSION: 1. Low lung volumes without radiographic evidence of acute cardiopulmonary disease. 2. Aortic atherosclerosis. Electronically Signed   By: Trudie Reed M.D.   On: 11/10/2016 17:30   Dg Pelvis 1-2 Views  Result Date: 11/10/2016 CLINICAL DATA:  83 year old female with history of unwitnessed fall. EXAM: PELVIS - 1-2 VIEW COMPARISON:  No priors. FINDINGS: There is no evidence of pelvic fracture or diastasis. No pelvic bone lesions are seen. IMPRESSION: Negative. Electronically Signed   By: Trudie Reed M.D.   On: 11/10/2016 17:31   Ct Head Wo Contrast  Result Date: 11/10/2016 CLINICAL DATA:  Unwitnessed fall.  Pain. EXAM: CT HEAD WITHOUT CONTRAST CT CERVICAL SPINE WITHOUT CONTRAST TECHNIQUE: Multidetector CT imaging of the head and cervical spine was performed following the standard protocol without intravenous  contrast. Multiplanar CT image reconstructions of the cervical spine were also generated. COMPARISON:  Head and cervical spine CT from 02/27/2016 and 12/14/2015 FINDINGS: CT HEAD: BRAIN: There is mild sulcal and marked chronic ventricular prominence consistent with superficial and central atrophy. Chronic mild cerebellar atrophy. No intraparenchymal hemorrhage, mass effect nor midline shift. Chronic moderate degree of periventricular and subcortical white matter hypodensities consistent with small vessel ischemic disease are again identified. No acute large vascular territory infarcts. No abnormal extra-axial fluid collections. Basal cisterns are not effaced and midline. Brainstem appears intact. VASCULAR: No hyperdense vessels. Mild calcific atherosclerosis of the carotid siphons. SKULL: No skull fracture. No significant scalp soft tissue swelling. SINUSES/ORBITS: The mastoid air-cells are clear. The included paranasal sinuses are well-aerated. Minimal ethmoid sinus mucosal thickening is noted.The included ocular globes and orbital contents are non-suspicious. OTHER: None. CT CERVICAL SPINE FINDINGS Alignment: Intact craniocervical relationship and atlantodental interval. Maintained cervical  lordosis. Skull base and vertebrae: No acute fracture. No primary bone lesion or focal pathologic process. Soft tissues and spinal canal: No prevertebral fluid or swelling. No visible canal hematoma. Disc levels: Cervical spondylosis is again noted with multilevel degenerative disc space narrowing, bilateral uncovertebral joint osteoarthritis with spurring and small posterior marginal osteophytes most prominent at C3-4 and C4-5. Upper chest: Minimal apical pleuroparenchymal scarring and thickening. Other: Stable hypodense nodule in the left thyroid gland measuring 1.5 cm maximum dimension unchanged dating back to 12/14/2015 and is without worrisome appearing features. IMPRESSION: 1. Chronic marked dilatation of the ventricles  consistent with central atrophy with moderate degree of small vessel ischemia. 2. No acute intracranial abnormality. 3. Cervical spondylosis without acute posttraumatic cervical spine fracture or subluxations. Electronically Signed   By: Tollie Eth M.D.   On: 11/10/2016 17:30   Ct Cervical Spine Wo Contrast  Result Date: 11/10/2016 CLINICAL DATA:  Unwitnessed fall.  Pain. EXAM: CT HEAD WITHOUT CONTRAST CT CERVICAL SPINE WITHOUT CONTRAST TECHNIQUE: Multidetector CT imaging of the head and cervical spine was performed following the standard protocol without intravenous contrast. Multiplanar CT image reconstructions of the cervical spine were also generated. COMPARISON:  Head and cervical spine CT from 02/27/2016 and 12/14/2015 FINDINGS: CT HEAD: BRAIN: There is mild sulcal and marked chronic ventricular prominence consistent with superficial and central atrophy. Chronic mild cerebellar atrophy. No intraparenchymal hemorrhage, mass effect nor midline shift. Chronic moderate degree of periventricular and subcortical white matter hypodensities consistent with small vessel ischemic disease are again identified. No acute large vascular territory infarcts. No abnormal extra-axial fluid collections. Basal cisterns are not effaced and midline. Brainstem appears intact. VASCULAR: No hyperdense vessels. Mild calcific atherosclerosis of the carotid siphons. SKULL: No skull fracture. No significant scalp soft tissue swelling. SINUSES/ORBITS: The mastoid air-cells are clear. The included paranasal sinuses are well-aerated. Minimal ethmoid sinus mucosal thickening is noted.The included ocular globes and orbital contents are non-suspicious. OTHER: None. CT CERVICAL SPINE FINDINGS Alignment: Intact craniocervical relationship and atlantodental interval. Maintained cervical lordosis. Skull base and vertebrae: No acute fracture. No primary bone lesion or focal pathologic process. Soft tissues and spinal canal: No prevertebral  fluid or swelling. No visible canal hematoma. Disc levels: Cervical spondylosis is again noted with multilevel degenerative disc space narrowing, bilateral uncovertebral joint osteoarthritis with spurring and small posterior marginal osteophytes most prominent at C3-4 and C4-5. Upper chest: Minimal apical pleuroparenchymal scarring and thickening. Other: Stable hypodense nodule in the left thyroid gland measuring 1.5 cm maximum dimension unchanged dating back to 12/14/2015 and is without worrisome appearing features. IMPRESSION: 1. Chronic marked dilatation of the ventricles consistent with central atrophy with moderate degree of small vessel ischemia. 2. No acute intracranial abnormality. 3. Cervical spondylosis without acute posttraumatic cervical spine fracture or subluxations. Electronically Signed   By: Tollie Eth M.D.   On: 11/10/2016 17:30    Procedures Procedures (including critical care time)  Medications Ordered in ED Medications - No data to display   Initial Impression / Assessment and Plan / ED Course  I have reviewed the triage vital signs and the nursing notes.  Pertinent labs & imaging results that were available during my care of the patient were reviewed by me and considered in my medical decision making (see chart for details).    Marilyn Mendez is a 83 y.o. female here with unwitnessed fall. Patient demented at baseline. No obvious scalp hematoma. No spinal tenderness. Slightly dec ROM R hip. Will get CT head/neck, xrays. Has hx  of falls, will not do syncope workup.   6:29 PM CT head/neck, xrays unremarkable. Will discharge back to nursing home.    Final Clinical Impressions(s) / ED Diagnoses   Final diagnoses:  None    New Prescriptions New Prescriptions   No medications on file     Charlynne Pander, MD 11/10/16 [id number]

## 2016-11-10 NOTE — ED Triage Notes (Signed)
Pt arrives to [facility] via [facility] c/o unwitnessed fall at [facility]. EMS reports staff found her on the floor and helped her to a wheelchair prior to their arrival. Nursing home staff said pt was holding the back of her head. No obvious injuries noted. Pt is nonverbal at baseline.

## 2016-11-10 NOTE — Discharge Instructions (Signed)
Continue tylenol 650 mg every 4 hrs as needed for headaches   See your doctor.   Return to ER if she has another fall, lethargy, vomiting, unable to walk.

## 2016-11-10 NOTE — ED Notes (Signed)
[facility] called  

## 2016-11-10 NOTE — ED Notes (Signed)
Patient transported to CT 

## 2017-05-11 ENCOUNTER — Encounter (HOSPITAL_COMMUNITY): Payer: Self-pay | Admitting: Emergency Medicine

## 2017-05-11 ENCOUNTER — Emergency Department (HOSPITAL_COMMUNITY)
Admission: EM | Admit: 2017-05-11 | Discharge: 2017-05-11 | Disposition: A | Discharge status: Home / Self Care | Payer: Medicare Other | Attending: Emergency Medicine | Admitting: Emergency Medicine

## 2017-05-11 DIAGNOSIS — Z7982 Long term (current) use of aspirin: Secondary | ICD-10-CM | POA: Diagnosis not present

## 2017-05-11 DIAGNOSIS — F0390 Unspecified dementia without behavioral disturbance: Secondary | ICD-10-CM | POA: Diagnosis not present

## 2017-05-11 DIAGNOSIS — E119 Type 2 diabetes mellitus without complications: Secondary | ICD-10-CM | POA: Insufficient documentation

## 2017-05-11 DIAGNOSIS — I10 Essential (primary) hypertension: Secondary | ICD-10-CM | POA: Insufficient documentation

## 2017-05-11 DIAGNOSIS — Z043 Encounter for examination and observation following other accident: Secondary | ICD-10-CM | POA: Insufficient documentation

## 2017-05-11 DIAGNOSIS — W19XXXA Unspecified fall, initial encounter: Secondary | ICD-10-CM

## 2017-05-11 DIAGNOSIS — Z79899 Other long term (current) drug therapy: Secondary | ICD-10-CM | POA: Diagnosis not present

## 2017-05-11 NOTE — ED Provider Notes (Signed)
[facility]-EMERGENCY DEPT Provider Note   CSN: [id number] Arrival date & time: 05/11/17  1249     History   Chief Complaint Chief Complaint  Patient presents with  . Fall    HPI Marilyn Mendez is a 84 y.o. female.  Patient is an 84 year old female with a history of dementia who is nonverbal at baseline, diabetes, hypertension who is presenting today after a fall that was witnessed at the nursing facility.  Patient was sitting in a wheelchair and fell out of the wheelchair face first onto the floor.  Facility reports she had no loss of consciousness and has been otherwise acting her normal self.  Patient does not take anticoagulation.  The history is provided by the EMS personnel and the nursing home. The history is limited by the absence of a caregiver.  Fall  This is a new problem. The current episode started 1 to 2 hours ago. The problem occurs constantly. The problem has not changed since onset.   Past Medical History:  Diagnosis Date  . Alzheimer's dementia   . CVA (cerebral infarction)   . Diabetes mellitus without complication (HCC)   . Hypernatremia   . Hyperosmolality   . Hypertension   . Vitamin D deficiency     There are no active problems to display for this patient.   History reviewed. No pertinent surgical history.   OB History   None      Home Medications    Prior to Admission medications   Medication Sig Start Date End Date Taking? Authorizing Provider  acetaminophen (TYLENOL) 500 MG tablet Take 500 mg by mouth every 4 (four) hours as needed for mild pain, fever or headache.     [provider]  acetaminophen (TYLENOL) 650 MG CR tablet Take 650 mg by mouth 3 (three) times daily as needed for pain.    [provider]  alum & mag hydroxide-simeth (MINTOX) 200-200-20 MG/5ML suspension Take 30 mLs by mouth as needed for indigestion or heartburn.     [provider]  aspirin 81 MG chewable tablet Chew  81 mg by mouth every morning.    [provider]  Calcium Carb-Cholecalciferol (CALCIUM 500 +D) 500-400 MG-UNIT TABS Take 1 tablet by mouth daily with breakfast.     [provider]  diphenhydrAMINE (BENADRYL) 25 mg capsule Take 25 mg by mouth 2 (two) times daily as needed for itching.    [provider]  ferrous sulfate 220 (44 Fe) MG/5ML solution Take 330 mg by mouth at bedtime.    [provider]  guaifenesin (ROBITUSSIN) 100 MG/5ML syrup Take 200 mg by mouth every 6 (six) hours as needed for cough.    [provider]  hydrALAZINE (APRESOLINE) 50 MG tablet Take 50 mg by mouth 3 (three) times daily.    [provider]  loperamide (IMODIUM) 2 MG capsule Take 2 mg by mouth as needed for diarrhea or loose stools.     [provider]  losartan (COZAAR) 25 MG tablet Take 25 mg by mouth every morning.    [provider]  magnesium hydroxide (MILK OF MAGNESIA) 400 MG/5ML suspension Take 30 mLs by mouth at bedtime as needed for mild constipation.     [provider]  neomycin-bacitracin-polymyxin (NEOSPORIN) ointment Apply 1 application topically as needed for wound care.     [provider]  Nutritional Supplements (NUTRITIONAL DRINK) LIQD Take 1 Can by mouth 3 (three) times daily. *Mighty Shakes*  [provider]  ondansetron (ZOFRAN) 4 MG tablet Take 4 mg by mouth every 6 (six) hours as needed for nausea or vomiting.    [provider]    Family History No family history on file.  Social History Social History   Tobacco Use  . Smoking status: Never Smoker  . Smokeless tobacco: Never Used  Substance Use Topics  . Alcohol use: No  . Drug use: No     Allergies   Patient has no known allergies.   Review of Systems Review of Systems  Unable to perform ROS: Dementia     Physical Exam Updated Vital Signs BP 117/67 (BP Location: Right Arm)   Pulse 60   Temp 97.8 F (36.6 C)  (Oral)   Resp 18   SpO2 100%   Physical Exam  Constitutional: She appears well-developed and well-nourished. No distress.  HENT:  Head: Normocephalic and atraumatic.  Mouth/Throat: Oropharynx is clear and moist.  Eyes: Pupils are equal, round, and reactive to light. Conjunctivae and EOM are normal.  Neck: Normal range of motion and full passive range of motion without pain. Neck supple. No spinous process tenderness and no muscular tenderness present. Normal range of motion present.  Cardiovascular: Normal rate, regular rhythm and intact distal pulses.  No murmur heard. Pulmonary/Chest: Effort normal and breath sounds normal. No respiratory distress. She has no wheezes. She has no rales.  Abdominal: Soft. She exhibits no distension. There is no tenderness. There is no rebound and no guarding.  Musculoskeletal: Normal range of motion. She exhibits no edema or tenderness.       Right shoulder: Normal.       Left shoulder: Normal.       Right hip: Normal.       Left hip: Normal.  Neurological: She is alert.  Awake and alert.  Moving upper and lower extremities without difficulty or evidence of pain.  Patient does not follow commands  Skin: Skin is warm and dry. No rash noted. No erythema.  Psychiatric:  nonverbal  Nursing note and vitals reviewed.    ED Treatments / Results  Labs (all labs ordered are listed, but only abnormal results are displayed) Labs Reviewed - No data to display  EKG None  Radiology No results found.  Procedures Procedures (including critical care time)  Medications Ordered in ED Medications - No data to display   Initial Impression / Assessment and Plan / ED Course  I have reviewed the triage vital signs and the nursing notes.  Pertinent labs & imaging results that were available during my care of the patient were reviewed by me and considered in my medical decision making (see chart for details).     Elderly patient who is nonverbal at  baseline with a mechanical fall from her wheelchair today that was witnessed by staff.  No loss of consciousness and patient has been at her baseline.  On exam patient is in no acute distress.  She does not speak but is moving all extremities without difficulty.  Has no reproducible pain does not take anticoagulation.  She has no evidence of trauma to the head or face.  This time feel that patient has been reasonably medically cleared and can be discharged back to her facility.  Final Clinical Impressions(s) / ED Diagnoses   Final diagnoses:  Fall, initial encounter    ED Discharge Orders    None       Gwyneth SproutPlunkett, Laquanna Veazey, MD 05/11/17 1524

## 2017-05-11 NOTE — ED Notes (Signed)
Report called to [facility] 

## 2017-05-11 NOTE — ED Triage Notes (Signed)
Per EMS[facility], staff reports patient fell forward out of chair. Hx dementia. Patient is non verbal. Alert. Patient taking c-collar off. Does not follow commands. No LOC or blood thinners per staff.

## 2017-05-11 NOTE — ED Notes (Signed)
Family at bedside. 

## 2017-05-11 NOTE — ED Notes (Signed)
[facility] Expected date:  Expected time:  Means of arrival:  Comments: 84 yo fall, Alzheimers, no injury

## 2017-05-11 NOTE — ED Notes (Signed)
[facility] called for transport.  

## 2017-05-25 ENCOUNTER — Emergency Department (HOSPITAL_COMMUNITY): Payer: Medicare Other

## 2017-05-25 ENCOUNTER — Emergency Department (HOSPITAL_COMMUNITY)
Admission: EM | Admit: 2017-05-25 | Discharge: 2017-05-25 | Disposition: A | Discharge status: Home / Self Care | Payer: Medicare Other | Attending: Emergency Medicine | Admitting: Emergency Medicine

## 2017-05-25 ENCOUNTER — Encounter (HOSPITAL_COMMUNITY): Payer: Self-pay

## 2017-05-25 ENCOUNTER — Other Ambulatory Visit: Payer: Self-pay

## 2017-05-25 DIAGNOSIS — I10 Essential (primary) hypertension: Secondary | ICD-10-CM | POA: Insufficient documentation

## 2017-05-25 DIAGNOSIS — G309 Alzheimer's disease, unspecified: Secondary | ICD-10-CM | POA: Insufficient documentation

## 2017-05-25 DIAGNOSIS — Z79899 Other long term (current) drug therapy: Secondary | ICD-10-CM | POA: Insufficient documentation

## 2017-05-25 DIAGNOSIS — Z8673 Personal history of transient ischemic attack (TIA), and cerebral infarction without residual deficits: Secondary | ICD-10-CM | POA: Diagnosis not present

## 2017-05-25 DIAGNOSIS — Z7982 Long term (current) use of aspirin: Secondary | ICD-10-CM | POA: Insufficient documentation

## 2017-05-25 DIAGNOSIS — E119 Type 2 diabetes mellitus without complications: Secondary | ICD-10-CM | POA: Insufficient documentation

## 2017-05-25 DIAGNOSIS — Y92129 Unspecified place in nursing home as the place of occurrence of the external cause: Secondary | ICD-10-CM | POA: Diagnosis not present

## 2017-05-25 DIAGNOSIS — Y939 Activity, unspecified: Secondary | ICD-10-CM | POA: Diagnosis not present

## 2017-05-25 DIAGNOSIS — W19XXXA Unspecified fall, initial encounter: Secondary | ICD-10-CM | POA: Diagnosis not present

## 2017-05-25 DIAGNOSIS — F0280 Dementia in other diseases classified elsewhere without behavioral disturbance: Secondary | ICD-10-CM | POA: Diagnosis not present

## 2017-05-25 DIAGNOSIS — S0093XA Contusion of unspecified part of head, initial encounter: Secondary | ICD-10-CM | POA: Diagnosis not present

## 2017-05-25 DIAGNOSIS — S0990XA Unspecified injury of head, initial encounter: Secondary | ICD-10-CM | POA: Diagnosis present

## 2017-05-25 DIAGNOSIS — Y998 Other external cause status: Secondary | ICD-10-CM | POA: Diagnosis not present

## 2017-05-25 LAB — BASIC METABOLIC PANEL
Anion gap: 10 (ref 5–15)
BUN: 23 mg/dL — ABNORMAL HIGH (ref 6–20)
CHLORIDE: 106 mmol/L (ref 101–111)
CO2: 25 mmol/L (ref 22–32)
CREATININE: 1.14 mg/dL — AB (ref 0.44–1.00)
Calcium: 9.3 mg/dL (ref 8.9–10.3)
GFR calc non Af Amer: 44 mL/min — ABNORMAL LOW (ref >60)
GFR, EST AFRICAN AMERICAN: 50 mL/min — AB (ref >60)
Glucose, Bld: 99 mg/dL (ref 65–99)
Potassium: 4.5 mmol/L (ref 3.5–5.1)
Sodium: 141 mmol/L (ref 135–145)

## 2017-05-25 LAB — CBC
HEMATOCRIT: 29.8 % — AB (ref 36.0–46.0)
Hemoglobin: 9.5 g/dL — ABNORMAL LOW (ref 12.0–15.0)
MCH: 27.5 pg (ref 26.0–34.0)
MCHC: 31.9 g/dL (ref 30.0–36.0)
MCV: 86.4 fL (ref 78.0–100.0)
Platelets: 188 10*3/uL (ref 150–400)
RBC: 3.45 MIL/uL — ABNORMAL LOW (ref 3.87–5.11)
RDW: 15.8 % — ABNORMAL HIGH (ref 11.5–15.5)
WBC: 7.1 10*3/uL (ref 4.0–10.5)

## 2017-05-25 NOTE — ED Notes (Signed)
[facility] called for pt 

## 2017-05-25 NOTE — ED Triage Notes (Signed)
She had and [facility]itness[facility] fall [facility] today. She has an area of edema/ecchymosis at right forehead area. She arrives awake, alert and minimally verbal, which is her normal mentation. She moves all extremities freely and is rather busy (fidgety).

## 2017-05-25 NOTE — Discharge Instructions (Signed)
Apply ice to help with the swelling, over-the-counter medications as needed for pain

## 2017-05-25 NOTE — ED Provider Notes (Signed)
[facility]-EMERGENCY DEPT Provider Note   CSN: [id number] Arrival date & time: 05/25/17  1722     History   Chief Complaint Chief Complaint  Patient presents with  . Fall    HPI Marilyn Mendez is a 84 y.o. female.  HPI The patient presents to the emergency room for evaluation of an unwitnessed fall.  Patient resides at [facility] nursing care center.  Patient has a history of frequent falls.  She has history of dementia and is nonverbal at baseline.  Patient was last seen in the emergency room on April 7 after another fall.   According to the EMS report the patient had an unwitnessed fall today.  He noted a large goose egg in the middle of her forehead.  Patient has been awake.  She is at her typical mental baseline which is awake and minimally verbal.  Patient looks at me and nods her head slightly in response to some questions.  She does follow commands.  She does not verbalize any specific concerns. Past Medical History:  Diagnosis Date  . Alzheimer's dementia   . CVA (cerebral infarction)   . Diabetes mellitus without complication (HCC)   . Hypernatremia   . Hyperosmolality   . Hypertension   . Vitamin D deficiency     There are no active problems to display for this patient.   No past surgical history on file.   OB History   None      Home Medications    Prior to Admission medications   Medication Sig Start Date End Date Taking? Authorizing Provider  acetaminophen (TYLENOL) 500 MG tablet Take 500 mg by mouth every 4 (four) hours as needed for mild pain, fever or headache.     [provider]  acetaminophen (TYLENOL) 650 MG CR tablet Take 650 mg by mouth 3 (three) times daily as needed for pain.    [provider]  alum & mag hydroxide-simeth (MINTOX) 200-200-20 MG/5ML suspension Take 30 mLs by mouth as needed for indigestion or heartburn.     [provider]  aspirin 81 MG chewable tablet Chew 81 mg by  mouth every morning.    [provider]  Calcium Carb-Cholecalciferol (CALCIUM 500 +D) 500-400 MG-UNIT TABS Take 1 tablet by mouth daily with breakfast.     [provider]  diphenhydrAMINE (BENADRYL) 25 mg capsule Take 25 mg by mouth 2 (two) times daily as needed for itching.    [provider]  ferrous sulfate 220 (44 Fe) MG/5ML solution Take 330 mg by mouth at bedtime.    [provider]  guaifenesin (ROBITUSSIN) 100 MG/5ML syrup Take 200 mg by mouth every 6 (six) hours as needed for cough.    [provider]  hydrALAZINE (APRESOLINE) 50 MG tablet Take 50 mg by mouth 3 (three) times daily.    [provider]  loperamide (IMODIUM) 2 MG capsule Take 2 mg by mouth as needed for diarrhea or loose stools.     [provider]  losartan (COZAAR) 25 MG tablet Take 25 mg by mouth every morning.    [provider]  magnesium hydroxide (MILK OF MAGNESIA) 400 MG/5ML suspension Take 30 mLs by mouth at bedtime as needed for mild constipation.     [provider]  neomycin-bacitracin-polymyxin (NEOSPORIN) ointment Apply 1 application topically as needed for wound care.     [provider]  Nutritional Supplements (NUTRITIONAL DRINK) LIQD Take 1 Can by mouth 3 (three)  times daily. *Mighty Shakes*    [provider]  ondansetron (ZOFRAN) 4 MG tablet Take 4 mg by mouth every 6 (six) hours as needed for nausea or vomiting.    [provider]    Family History No family history on file.  Social History Social History   Tobacco Use  . Smoking status: Never Smoker  . Smokeless tobacco: Never Used  Substance Use Topics  . Alcohol use: No  . Drug use: No     Allergies   Patient has no known allergies.   Review of Systems Review of Systems  All other systems reviewed and are negative.    Physical Exam Updated Vital Signs BP (!) 143/70 (BP Location: Right Arm)   Pulse 63   Temp 99 F (37.2  C) (Oral)   Resp 15   SpO2 100%   Physical Exam  Constitutional: No distress.  HENT:  Head: Normocephalic.  Right Ear: External ear normal.  Left Ear: External ear normal.  Large hematoma mid forehead, tender to touch  Eyes: Conjunctivae are normal. Right eye exhibits no discharge. Left eye exhibits no discharge. No scleral icterus.  Neck: Neck supple. No tracheal deviation present.  Cardiovascular: Normal rate, regular rhythm and intact distal pulses.  Pulmonary/Chest: Effort normal and breath sounds normal. No stridor. No respiratory distress. She has no wheezes. She has no rales.  Abdominal: Soft. Bowel sounds are normal. She exhibits no distension. There is no tenderness. There is no rebound and no guarding.  Musculoskeletal: She exhibits no edema or tenderness.  Neurological: She is alert. No cranial nerve deficit (no facial droop, extraocular movements intact,  ) or sensory deficit. She exhibits normal muscle tone. She displays no seizure activity.  Patient does not answer any questions, patient does move both her hands and wiggle her toes when asked  Skin: Skin is warm and dry. No rash noted. She is not diaphoretic.  Psychiatric: She has a normal mood and affect.  Nursing note and vitals reviewed.    ED Treatments / Results  Labs (all labs ordered are listed, but only abnormal results are displayed) Labs Reviewed  CBC - Abnormal; Notable for the following components:      Result Value   RBC 3.45 (*)    Hemoglobin 9.5 (*)    HCT 29.8 (*)    RDW 15.8 (*)    All other components within normal limits  BASIC METABOLIC PANEL - Abnormal; Notable for the following components:   BUN 23 (*)    Creatinine, Ser 1.14 (*)    GFR calc non Af Amer 44 (*)    GFR calc Af Amer 50 (*)    All other components within normal limits     Radiology Ct Head Wo Contrast  Result Date: 05/25/2017 CLINICAL DATA:  Unwitnessed fall EXAM: CT HEAD WITHOUT CONTRAST CT CERVICAL SPINE WITHOUT  CONTRAST TECHNIQUE: Multidetector CT imaging of the head and cervical spine was performed following the standard protocol without intravenous contrast. Multiplanar CT image reconstructions of the cervical spine were also generated. COMPARISON:  11/10/2016 FINDINGS: CT HEAD FINDINGS Brain: Extensive chronic small vessel disease throughout the deep white matter. Severe dilatation of the lateral ventricles, stable. No hemorrhage or acute infarction. Vascular: No hyperdense vessel or unexpected calcification. Skull: No acute calvarial abnormality. Sinuses/Orbits: Visualized paranasal sinuses and mastoids clear. Orbital soft tissues unremarkable. Other: Soft tissue swelling over the forehead. CT CERVICAL SPINE FINDINGS Alignment: Normal Skull base and vertebrae: No fracture Soft tissues and spinal  canal: Prevertebral soft tissues are normal. No epidural or paraspinal hematoma. Disc levels:  Diffuse degenerative disc disease and facet disease. Upper chest: Biapical scarring. Other: No acute findings. IMPRESSION: Severe diffuse chronic small vessel disease throughout the white matter. Severe ventriculomegaly. These findings are stable since prior study. Ventriculomegaly may be related to expected dilatation although a degree of hydrocephalus cannot be excluded. No change. No acute findings or traumatic intracranial injury. Diffuse degenerative changes in the cervical spine. No acute bony abnormality. Electronically Signed   By: Charlett NoseKevin  Dover M.D.   On: 05/25/2017 19:21   Ct Cervical Spine Wo Contrast  Result Date: 05/25/2017 CLINICAL DATA:  Unwitnessed fall EXAM: CT HEAD WITHOUT CONTRAST CT CERVICAL SPINE WITHOUT CONTRAST TECHNIQUE: Multidetector CT imaging of the head and cervical spine was performed following the standard protocol without intravenous contrast. Multiplanar CT image reconstructions of the cervical spine were also generated. COMPARISON:  11/10/2016 FINDINGS: CT HEAD FINDINGS Brain: Extensive chronic  small vessel disease throughout the deep white matter. Severe dilatation of the lateral ventricles, stable. No hemorrhage or acute infarction. Vascular: No hyperdense vessel or unexpected calcification. Skull: No acute calvarial abnormality. Sinuses/Orbits: Visualized paranasal sinuses and mastoids clear. Orbital soft tissues unremarkable. Other: Soft tissue swelling over the forehead. CT CERVICAL SPINE FINDINGS Alignment: Normal Skull base and vertebrae: No fracture Soft tissues and spinal canal: Prevertebral soft tissues are normal. No epidural or paraspinal hematoma. Disc levels:  Diffuse degenerative disc disease and facet disease. Upper chest: Biapical scarring. Other: No acute findings. IMPRESSION: Severe diffuse chronic small vessel disease throughout the white matter. Severe ventriculomegaly. These findings are stable since prior study. Ventriculomegaly may be related to expected dilatation although a degree of hydrocephalus cannot be excluded. No change. No acute findings or traumatic intracranial injury. Diffuse degenerative changes in the cervical spine. No acute bony abnormality. Electronically Signed   By: Charlett NoseKevin  Dover M.D.   On: 05/25/2017 19:21    Procedures Procedures (including critical care time)  Medications Ordered in ED Medications - No data to display   Initial Impression / Assessment and Plan / ED Course  I have reviewed the triage vital signs and the nursing notes.  Pertinent labs & imaging results that were available during my care of the patient were reviewed by me and considered in my medical decision making (see chart for details).  Clinical Course as of May 26 2042  Wynelle LinkSun May 25, 2017  2041 Anemia stable   [JK]  2042 Anemia stable.  Renal function is at her baseline.   [JK]  2042 No acute CT findings associated with her injury.   [JK]    Clinical Course User Index [JK] Linwood DibblesKnapp, Cormick Moss, MD    Patient presented to the emergency room after recurrent fall.  Patient has  history of frequently falling.  ED workup is reassuring.  No signs of any serious injury associated with her fall.  Patient's laboratory tests are at her baseline.  Patient appears stable to be discharged back to her nursing facility.  Final Clinical Impressions(s) / ED Diagnoses   Final diagnoses:  Contusion of head, unspecified part of head, initial encounter    ED Discharge Orders    None       Linwood DibblesKnapp, Forney Kleinpeter, MD 05/25/17 2045

## 2017-05-25 NOTE — ED Notes (Signed)
[facility] Expected date: 05/25/17 Expected time:  Means of arrival:  Comments: Fall from memory care unit

## 2017-06-29 ENCOUNTER — Emergency Department (HOSPITAL_COMMUNITY)
Admission: EM | Admit: 2017-06-29 | Discharge: 2017-06-29 | Disposition: A | Discharge status: Skilled Nursing Facility | Payer: Medicare Other | Attending: Emergency Medicine | Admitting: Emergency Medicine

## 2017-06-29 ENCOUNTER — Encounter (HOSPITAL_COMMUNITY): Payer: Self-pay | Admitting: Emergency Medicine

## 2017-06-29 DIAGNOSIS — Z79899 Other long term (current) drug therapy: Secondary | ICD-10-CM | POA: Diagnosis not present

## 2017-06-29 DIAGNOSIS — G309 Alzheimer's disease, unspecified: Secondary | ICD-10-CM | POA: Diagnosis not present

## 2017-06-29 DIAGNOSIS — Z7982 Long term (current) use of aspirin: Secondary | ICD-10-CM | POA: Insufficient documentation

## 2017-06-29 DIAGNOSIS — Y939 Activity, unspecified: Secondary | ICD-10-CM | POA: Insufficient documentation

## 2017-06-29 DIAGNOSIS — E119 Type 2 diabetes mellitus without complications: Secondary | ICD-10-CM | POA: Diagnosis not present

## 2017-06-29 DIAGNOSIS — F0280 Dementia in other diseases classified elsewhere without behavioral disturbance: Secondary | ICD-10-CM | POA: Diagnosis not present

## 2017-06-29 DIAGNOSIS — Y92129 Unspecified place in nursing home as the place of occurrence of the external cause: Secondary | ICD-10-CM | POA: Diagnosis not present

## 2017-06-29 DIAGNOSIS — W19XXXA Unspecified fall, initial encounter: Secondary | ICD-10-CM | POA: Diagnosis not present

## 2017-06-29 DIAGNOSIS — S0993XA Unspecified injury of face, initial encounter: Secondary | ICD-10-CM | POA: Diagnosis present

## 2017-06-29 DIAGNOSIS — I10 Essential (primary) hypertension: Secondary | ICD-10-CM | POA: Insufficient documentation

## 2017-06-29 DIAGNOSIS — Y999 Unspecified external cause status: Secondary | ICD-10-CM | POA: Insufficient documentation

## 2017-06-29 DIAGNOSIS — S0181XA Laceration without foreign body of other part of head, initial encounter: Secondary | ICD-10-CM | POA: Diagnosis not present

## 2017-06-29 MED ORDER — LIDOCAINE-EPINEPHRINE 2 %-1:200000 IJ SOLN
10.0000 mL | Freq: Once | INTRAMUSCULAR | Status: AC
Start: 2017-06-29 — End: 2017-06-29
  Administered 2017-06-29: 10 mL
  Filled 2017-06-29: qty 20

## 2017-06-29 NOTE — ED Provider Notes (Signed)
[facility]-EMERGENCY DEPT Provider Note   CSN: [id number] Arrival date & time: 06/29/17  1600     History   Chief Complaint Chief Complaint  Patient presents with  . Fall  . Laceration    HPI Marilyn Mendez is a 84 y.o. female.  Pt presents to the ED today with fall.  The pt has a hx of frequent falls.  She has dementia and lives at [facility].  She had an unwitnessed fall.  She has a laceration to her left forehead.  No other injuries noted.  She is nonverbal and unable to give any hx.     Past Medical History:  Diagnosis Date  . Alzheimer's dementia   . CVA (cerebral infarction)   . Diabetes mellitus without complication (HCC)   . Hypernatremia   . Hyperosmolality   . Hypertension   . Vitamin D deficiency     There are no active problems to display for this patient.   History reviewed. No pertinent surgical history.   OB History   None      Home Medications    Prior to Admission medications   Medication Sig Start Date End Date Taking? Authorizing Provider  acetaminophen (TYLENOL) 500 MG tablet Take 500 mg by mouth every 4 (four) hours as needed for mild pain, fever or headache.     [provider]  acetaminophen (TYLENOL) 650 MG CR tablet Take 650 mg by mouth 3 (three) times daily as needed for pain.    [provider]  alum & mag hydroxide-simeth (MINTOX) 200-200-20 MG/5ML suspension Take 30 mLs by mouth as needed for indigestion or heartburn.     [provider]  aspirin 81 MG chewable tablet Chew 81 mg by mouth every morning.    [provider]  Calcium Carb-Cholecalciferol (CALCIUM 500 +D) 500-400 MG-UNIT TABS Take 1 tablet by mouth daily with breakfast.     [provider]  diphenhydrAMINE (BENADRYL) 25 mg capsule Take 25 mg by mouth 2 (two) times daily as needed for itching.    [provider]  ferrous sulfate 220 (44 Fe) MG/5ML solution Take 330 mg by mouth at bedtime.     [provider]  guaifenesin (ROBITUSSIN) 100 MG/5ML syrup Take 200 mg by mouth every 6 (six) hours as needed for cough.    [provider]  hydrALAZINE (APRESOLINE) 50 MG tablet Take 50 mg by mouth 3 (three) times daily.    [provider]  loperamide (IMODIUM) 2 MG capsule Take 2 mg by mouth as needed for diarrhea or loose stools.     [provider]  losartan (COZAAR) 25 MG tablet Take 25 mg by mouth every morning.    [provider]  magnesium hydroxide (MILK OF MAGNESIA) 400 MG/5ML suspension Take 30 mLs by mouth at bedtime as needed for mild constipation.     [provider]  neomycin-bacitracin-polymyxin (NEOSPORIN) ointment Apply 1 application topically as needed for wound care.     [provider]  Nutritional Supplements (NUTRITIONAL DRINK) LIQD Take 1 Can by mouth 3 (three) times daily. *Mighty Shakes*    [provider]  ondansetron (ZOFRAN) 4 MG tablet Take 4 mg by mouth every 6 (six) hours as needed for nausea or vomiting.    [provider]    Family History No family history on file.  Social History Social History   Tobacco Use  . Smoking status: Never Smoker  . Smokeless tobacco: Never Used  Substance Use Topics  . Alcohol use: No  . Drug use: No     Allergies   Patient has no known allergies.   Review of Systems Review of Systems  Skin: Positive for wound.  All other systems reviewed and are negative.    Physical Exam Updated Vital Signs BP (!) 124/96 (BP Location: Left Arm)   Pulse 68   Temp 98.7 F (37.1 C) (Oral)   Resp 18   SpO2 98%   Physical Exam  Constitutional: She appears well-developed.  1 cm lac above left forehead  HENT:  Head: Normocephalic and atraumatic.  Right Ear: External ear normal.  Left Ear: External ear normal.  Nose: Nose normal.  Mouth/Throat: Oropharynx is clear and moist.  Eyes: Pupils are equal, round, and reactive to light.  Conjunctivae and EOM are normal.  Neck: Normal range of motion. Neck supple.  Cardiovascular: Normal rate, regular rhythm, normal heart sounds and intact distal pulses.  Pulmonary/Chest: Effort normal and breath sounds normal.  Abdominal: Soft. Bowel sounds are normal.  Musculoskeletal: Normal range of motion.  Neurological: She is alert.  Pt is nonverbal which is chronic.  She is not following commands which is chronic.  She is moving all 4 extremities.    Skin: Skin is warm. Capillary refill takes less than 2 seconds.  Psychiatric:  Unable to assess due to nonverbal status  Nursing note and vitals reviewed.    ED Treatments / Results  Labs (all labs ordered are listed, but only abnormal results are displayed) Labs Reviewed - No data to display  EKG None  Radiology No results found.  Procedures .Marland KitchenLaceration Repair Date/Time: 06/29/2017 5:14 PM Performed by: Jacalyn Lefevre, MD Authorized by: Jacalyn Lefevre, MD   Consent:    Consent obtained:  Emergent situation Anesthesia (see MAR for exact dosages):    Anesthesia method:  Local infiltration   Local anesthetic:  Lidocaine 2% WITH epi Laceration details:    Location:  Face   Face location:  L eyebrow   Length (cm):  1 Repair type:    Repair type:  Simple Pre-procedure details:    Preparation:  Patient was prepped and draped in usual sterile fashion Exploration:    Hemostasis achieved with:  Epinephrine   Contaminated: no   Treatment:    Area cleansed with:  Hibiclens   Amount of cleaning:  Standard Skin repair:    Repair method:  Sutures   Suture size:  6-0   Suture material:  Prolene   Suture technique:  Simple interrupted   Number of sutures:  2 Post-procedure details:    Dressing:  Antibiotic ointment and non-adherent dressing   Patient tolerance of procedure:  Tolerated well, no immediate complications Comments:     Pt has dementia and there is no one available for consent.  This is an emergency  situation and she is here for wound repair, so I sutured her without consent.   (including critical care time)  Medications Ordered in ED Medications  lidocaine-EPINEPHrine (XYLOCAINE W/EPI) 2 %-1:200000 (PF) injection 10 mL (10 mLs Infiltration Given by Other 06/29/17 1620)     Initial Impression / Assessment and Plan / ED Course  I have reviewed the triage vital signs and the nursing notes.  Pertinent labs & imaging results that were available during my care of the patient were reviewed by me and considered in my medical decision making (see chart for details).    Pt is her normal mental status and is not on  blood thinners, so I did not think pt needed a CT scan.  Pt is stable for d/c.  Return if worse.   Final Clinical Impressions(s) / ED Diagnoses   Final diagnoses:  Facial laceration, initial encounter  Fall, initial encounter    ED Discharge Orders    None       Jacalyn Lefevre, MD 06/29/17 [phone]

## 2017-06-29 NOTE — ED Triage Notes (Signed)
Per EMS, patient from [facility], staff reports unwitnessed fall today. Laceration above left eye. Bleeding controlled. No blood thinners noted. Hx dementia. Non verbal and does not follow commands. Baseline per staff.

## 2017-06-29 NOTE — ED Notes (Signed)
[facility] called for transport.  

## 2017-06-29 NOTE — ED Notes (Signed)
Bed: WA03 Expected date:  Expected time:  Means of arrival:  Comments: EMS:84yo Fall, Lac

## 2017-06-29 NOTE — ED Notes (Signed)
Attempted to call report to [facility] with no answer. 

## 2017-08-30 ENCOUNTER — Other Ambulatory Visit: Payer: Self-pay

## 2017-08-30 ENCOUNTER — Emergency Department (HOSPITAL_COMMUNITY): Payer: Medicare Other

## 2017-08-30 ENCOUNTER — Encounter (HOSPITAL_COMMUNITY): Payer: Self-pay

## 2017-08-30 ENCOUNTER — Emergency Department (HOSPITAL_COMMUNITY)
Admission: EM | Admit: 2017-08-30 | Discharge: 2017-08-31 | Disposition: A | Discharge status: Home / Self Care | Payer: Medicare Other | Attending: Emergency Medicine | Admitting: Emergency Medicine

## 2017-08-30 DIAGNOSIS — W19XXXA Unspecified fall, initial encounter: Secondary | ICD-10-CM | POA: Insufficient documentation

## 2017-08-30 DIAGNOSIS — Y92129 Unspecified place in nursing home as the place of occurrence of the external cause: Secondary | ICD-10-CM | POA: Insufficient documentation

## 2017-08-30 DIAGNOSIS — Z79899 Other long term (current) drug therapy: Secondary | ICD-10-CM | POA: Insufficient documentation

## 2017-08-30 DIAGNOSIS — Z7982 Long term (current) use of aspirin: Secondary | ICD-10-CM | POA: Insufficient documentation

## 2017-08-30 DIAGNOSIS — I10 Essential (primary) hypertension: Secondary | ICD-10-CM | POA: Diagnosis not present

## 2017-08-30 DIAGNOSIS — Y998 Other external cause status: Secondary | ICD-10-CM | POA: Diagnosis not present

## 2017-08-30 DIAGNOSIS — E119 Type 2 diabetes mellitus without complications: Secondary | ICD-10-CM | POA: Insufficient documentation

## 2017-08-30 DIAGNOSIS — Y939 Activity, unspecified: Secondary | ICD-10-CM | POA: Diagnosis not present

## 2017-08-30 DIAGNOSIS — G309 Alzheimer's disease, unspecified: Secondary | ICD-10-CM | POA: Insufficient documentation

## 2017-08-30 DIAGNOSIS — S0003XA Contusion of scalp, initial encounter: Secondary | ICD-10-CM | POA: Diagnosis not present

## 2017-08-30 DIAGNOSIS — Z8673 Personal history of transient ischemic attack (TIA), and cerebral infarction without residual deficits: Secondary | ICD-10-CM | POA: Insufficient documentation

## 2017-08-30 DIAGNOSIS — S0990XA Unspecified injury of head, initial encounter: Secondary | ICD-10-CM | POA: Diagnosis present

## 2017-08-30 NOTE — ED Notes (Signed)
Bed: [id number] Expected date:  Expected time:  Means of arrival:  Comments: 84 yo F/Fall

## 2017-08-30 NOTE — ED Provider Notes (Signed)
[facility]-EMERGENCY DEPT Provider Note   CSN: [id number] Arrival date & time: 08/30/17  2053     History   Chief Complaint Chief Complaint  Patient presents with  . Fall    HPI Marilyn Mendez is a 84 y.o. female.  HPI Patient with history of severe dementia.  Baseline nonverbal.  Level 5 caveat applies. Witnessed fall at nursing home.  EMS called.  Cervical collar placed. Past Medical History:  Diagnosis Date  . Alzheimer's dementia   . CVA (cerebral infarction)   . Diabetes mellitus without complication (HCC)   . Hypernatremia   . Hyperosmolality   . Hypertension   . Vitamin D deficiency     There are no active problems to display for this patient.   History reviewed. No pertinent surgical history.   OB History   None      Home Medications    Prior to Admission medications   Medication Sig Start Date End Date Taking? Authorizing Provider  acetaminophen (TYLENOL) 500 MG tablet Take 500 mg by mouth every 4 (four) hours as needed for mild pain, fever or headache.     [provider]  acetaminophen (TYLENOL) 650 MG CR tablet Take 650 mg by mouth 3 (three) times daily as needed for pain.    [provider]  alum & mag hydroxide-simeth (MINTOX) 200-200-20 MG/5ML suspension Take 30 mLs by mouth as needed for indigestion or heartburn.     [provider]  aspirin 81 MG chewable tablet Chew 81 mg by mouth every morning.    [provider]  Calcium Carb-Cholecalciferol (CALCIUM 500 +D) 500-400 MG-UNIT TABS Take 1 tablet by mouth daily with breakfast.     [provider]  diphenhydrAMINE (BENADRYL) 25 mg capsule Take 25 mg by mouth 2 (two) times daily as needed for itching.    [provider]  ferrous sulfate 220 (44 Fe) MG/5ML solution Take 330 mg by mouth at bedtime.    [provider]  guaifenesin (ROBITUSSIN) 100 MG/5ML syrup Take 200 mg by mouth every 6 (six) hours as needed for  cough.    [provider]  hydrALAZINE (APRESOLINE) 50 MG tablet Take 50 mg by mouth 3 (three) times daily.    [provider]  loperamide (IMODIUM) 2 MG capsule Take 2 mg by mouth as needed for diarrhea or loose stools.     [provider]  losartan (COZAAR) 25 MG tablet Take 25 mg by mouth every morning.    [provider]  magnesium hydroxide (MILK OF MAGNESIA) 400 MG/5ML suspension Take 30 mLs by mouth at bedtime as needed for mild constipation.     [provider]  neomycin-bacitracin-polymyxin (NEOSPORIN) ointment Apply 1 application topically as needed for wound care.     [provider]  Nutritional Supplements (NUTRITIONAL DRINK) LIQD Take 1 Can by mouth 3 (three) times daily. *Mighty Shakes*    [provider]  ondansetron (ZOFRAN) 4 MG tablet Take 4 mg by mouth every 6 (six) hours as needed for nausea or vomiting.    [provider]    Family History No family history on file.  Social History Social History   Tobacco Use  . Smoking status: Never Smoker  . Smokeless tobacco: Never Used  Substance Use Topics  . Alcohol use: No  . Drug use: No     Allergies   Patient has no known allergies.   Review of Systems Review of Systems  Unable  to perform ROS: Dementia     Physical Exam Updated Vital Signs BP 127/63 (BP Location: Right Arm)   Pulse 88   Resp 16   Wt 46.3 kg (102 lb)   SpO2 100%   Physical Exam  Constitutional: She appears well-developed and well-nourished. No distress.  HENT:  Head: Normocephalic.  Mouth/Throat: Oropharynx is clear and moist.  Patient has right eyebrow hematoma.  She has swelling around the base of the nose with small amount of blood coming from the left nare.  Midface is stable.  No posterior scalp injury.  Eyes: Pupils are equal, round, and reactive to light. EOM are normal.  Neck:  Cervical collar in place.  Cardiovascular: Normal rate and regular rhythm.  Exam reveals no gallop and no friction rub.  No murmur heard. Pulmonary/Chest: Effort normal and breath sounds normal. No stridor. No respiratory distress. She has no wheezes. She has no rales. She exhibits no tenderness.  Abdominal: Soft. Bowel sounds are normal. There is no tenderness. There is no rebound and no guarding.  Musculoskeletal: Normal range of motion. She exhibits no edema or tenderness.  Neurological: She is alert.  Patient is awake.  She is nonverbal.  She is not following commands.  She does appear to move all extremities without focal deficit.  Skin: Skin is warm and dry. Capillary refill takes less than 2 seconds. No rash noted. She is not diaphoretic. No erythema.  Nursing note and vitals reviewed.    ED Treatments / Results  Labs (all labs ordered are listed, but only abnormal results are displayed) Labs Reviewed - No data to display  EKG None  Radiology No results found.  Procedures Procedures (including critical care time)  Medications Ordered in ED Medications - No data to display   Initial Impression / Assessment and Plan / ED Course  I have reviewed the triage vital signs and the nursing notes.  Pertinent labs & imaging results that were available during my care of the patient were reviewed by me and considered in my medical decision making (see chart for details).     CT head, cervical spine and maxillofacial without acute findings.  Discharged back to nursing facility with return precautions.   Final Clinical Impressions(s) / ED Diagnoses   Final diagnoses:  Scalp hematoma, initial encounter  Fall, initial encounter    ED Discharge Orders    None       Loren RacerYelverton, Allana Shrestha, MD 09/03/17 1352

## 2017-08-30 NOTE — ED Triage Notes (Signed)
Pt coming from [facility] c/o witnessed fall. Reported that she was running through the hall when she fell and hit her right forehead. No LOC, no blood thinners. A&Ox0 at baseline

## 2017-08-30 NOTE — ED Notes (Signed)
[facility] contacted. Paperwork printed

## 2017-08-30 NOTE — ED Notes (Signed)
Pt is unresponsive to any questions. A&Ox0 is her baseline. Stretcher is locked and in lowest position and call bell is in reach.

## 2017-08-31 NOTE — ED Notes (Signed)
Pt unable to sign for self. Witnessed by Kipp BroodSara Doster,RN

## 2017-10-08 ENCOUNTER — Encounter (HOSPITAL_COMMUNITY): Payer: Self-pay

## 2017-10-08 ENCOUNTER — Emergency Department (HOSPITAL_COMMUNITY)
Admission: EM | Admit: 2017-10-08 | Discharge: 2017-10-08 | Disposition: A | Discharge status: Home / Self Care | Payer: Medicare Other | Attending: Emergency Medicine | Admitting: Emergency Medicine

## 2017-10-08 ENCOUNTER — Other Ambulatory Visit: Payer: Self-pay

## 2017-10-08 DIAGNOSIS — Y92129 Unspecified place in nursing home as the place of occurrence of the external cause: Secondary | ICD-10-CM | POA: Diagnosis not present

## 2017-10-08 DIAGNOSIS — W1830XA Fall on same level, unspecified, initial encounter: Secondary | ICD-10-CM | POA: Diagnosis not present

## 2017-10-08 DIAGNOSIS — Z79899 Other long term (current) drug therapy: Secondary | ICD-10-CM | POA: Insufficient documentation

## 2017-10-08 DIAGNOSIS — E119 Type 2 diabetes mellitus without complications: Secondary | ICD-10-CM | POA: Insufficient documentation

## 2017-10-08 DIAGNOSIS — Y999 Unspecified external cause status: Secondary | ICD-10-CM | POA: Diagnosis not present

## 2017-10-08 DIAGNOSIS — S0181XA Laceration without foreign body of other part of head, initial encounter: Secondary | ICD-10-CM | POA: Diagnosis not present

## 2017-10-08 DIAGNOSIS — S0083XA Contusion of other part of head, initial encounter: Secondary | ICD-10-CM | POA: Insufficient documentation

## 2017-10-08 DIAGNOSIS — Y939 Activity, unspecified: Secondary | ICD-10-CM | POA: Diagnosis not present

## 2017-10-08 DIAGNOSIS — F0390 Unspecified dementia without behavioral disturbance: Secondary | ICD-10-CM | POA: Diagnosis not present

## 2017-10-08 DIAGNOSIS — I10 Essential (primary) hypertension: Secondary | ICD-10-CM | POA: Diagnosis not present

## 2017-10-08 DIAGNOSIS — W010XXA Fall on same level from slipping, tripping and stumbling without subsequent striking against object, initial encounter: Secondary | ICD-10-CM

## 2017-10-08 MED ORDER — LIDOCAINE-EPINEPHRINE (PF) 2 %-1:200000 IJ SOLN
20.0000 mL | Freq: Once | INTRAMUSCULAR | Status: AC
  Administered 2017-10-08: 20 mL
  Filled 2017-10-08: qty 20

## 2017-10-08 NOTE — ED Notes (Signed)
Pt unable to sign for discharge 

## 2017-10-08 NOTE — ED Notes (Signed)
[facility] contacted for transport 

## 2017-10-08 NOTE — Discharge Instructions (Addendum)
It was our pleasure to provide your ER care today - we hope that you feel better.  Keep wound very clean. Have sutures removed, by your doctor/nurse, in 1 week.   Fall precautions.  Return to ER if worse, new symptoms, persistent vomiting, change in mental status,  new or severe pain, other concern.

## 2017-10-08 NOTE — ED Triage Notes (Signed)
Pt brought by [facility] due to an unwitnessed fall at [facility]. Per EMS- pt has a hematoma on her right eyebrow. Bleeding has been controlled. Pt is not taking blood thinners. Per EMS- no obvious deformities noted. Per EMS, pt has dementia and is non-verbal.

## 2017-10-08 NOTE — ED Provider Notes (Addendum)
[facility]-EMERGENCY DEPT Provider Note   CSN: [id number] Arrival date & time: 10/08/17  2111     History   Chief Complaint No chief complaint on file.   HPI Marilyn Mendez is a 84 y.o. female.  Patient from [facility], hx dementia, s/p fall tonight at [facility]. No noted loc. Post fall, pts mental status reported to remain c/w baseline - advanced dementia, non verbal - level 5 caveat. Pt with small laceration and contusion to right forehead. No anticoagulant use. Tetanus up to date.   The history is provided by the patient and the EMS personnel. The history is limited by the condition of the patient.    Past Medical History:  Diagnosis Date  . Alzheimer's dementia   . CVA (cerebral infarction)   . Diabetes mellitus without complication (HCC)   . Hypernatremia   . Hyperosmolality   . Hypertension   . Vitamin D deficiency     There are no active problems to display for this patient.   No past surgical history on file.   OB History   None      Home Medications    Prior to Admission medications   Medication Sig Start Date End Date Taking? Authorizing Provider  acetaminophen (TYLENOL) 500 MG tablet Take 500 mg by mouth every 4 (four) hours as needed for mild pain, fever or headache.     [provider]  acetaminophen (TYLENOL) 650 MG CR tablet Take 650 mg by mouth 3 (three) times daily as needed for pain.    [provider]  alum & mag hydroxide-simeth (MINTOX) 200-200-20 MG/5ML suspension Take 30 mLs by mouth as needed for indigestion or heartburn.     [provider]  aspirin 81 MG chewable tablet Chew 81 mg by mouth every morning.    [provider]  Calcium Carb-Cholecalciferol (CALCIUM 500 +D) 500-400 MG-UNIT TABS Take 1 tablet by mouth daily with breakfast.     [provider]  diphenhydrAMINE (BENADRYL) 25 mg capsule Take 25 mg by mouth 2 (two) times daily as needed for itching.    [provider]   ferrous sulfate 220 (44 Fe) MG/5ML solution Take 330 mg by mouth at bedtime.    [provider]  guaifenesin (ROBITUSSIN) 100 MG/5ML syrup Take 200 mg by mouth every 6 (six) hours as needed for cough.    [provider]  hydrALAZINE (APRESOLINE) 50 MG tablet Take 50 mg by mouth 3 (three) times daily.    [provider]  loperamide (IMODIUM) 2 MG capsule Take 2 mg by mouth as needed for diarrhea or loose stools.     [provider]  losartan (COZAAR) 25 MG tablet Take 25 mg by mouth every morning.    [provider]  magnesium hydroxide (MILK OF MAGNESIA) 400 MG/5ML suspension Take 30 mLs by mouth at bedtime as needed for mild constipation.     [provider]  neomycin-bacitracin-polymyxin (NEOSPORIN) ointment Apply 1 application topically as needed for wound care.     [provider]  Nutritional Supplements (NUTRITIONAL DRINK) LIQD Take 1 Can by mouth 3 (three) times daily. *Mighty Shakes*    [provider]  ondansetron (ZOFRAN) 4 MG tablet Take 4 mg by mouth every 6 (six) hours as needed for nausea or vomiting.    [provider]    Family History No family history on file.  Social History Social History   Tobacco Use  . Smoking status: Never Smoker  .  Smokeless tobacco: Never Used  Substance Use Topics  . Alcohol use: No  . Drug use: No     Allergies   Patient has no known allergies.   Review of Systems Review of Systems  Unable to perform ROS: Dementia  level 5 caveat, dementia, not verbally responsive.      Physical Exam Updated Vital Signs There were no vitals taken for this visit.  Physical Exam  Constitutional: She appears well-developed and well-nourished.  HENT:  Contusion and 3 cm laceration to right forehead.   Eyes: Pupils are equal, round, and reactive to light. Conjunctivae are normal. No scleral icterus.  Neck: Neck supple. No tracheal deviation present.    Cardiovascular: Normal rate, regular rhythm, normal heart sounds and intact distal pulses.  Pulmonary/Chest: Effort normal and breath sounds normal. No respiratory distress. She exhibits no tenderness.  Abdominal: Soft. Normal appearance. She exhibits no distension. There is no tenderness.  Genitourinary:  Genitourinary Comments: No cva tenderness  Musculoskeletal: She exhibits no edema.  CTLS spine, non tender, aligned, no step off. Good passive rom bil extremities without pain or focal bony tenderness.   Neurological: She is alert.  Awake and alert, looks about room, and moves bil extremities purposefully. Mental status described as being c/w baseline.   Skin: Skin is warm and dry. No rash noted.  Psychiatric: She has a normal mood and affect.  Nursing note and vitals reviewed.    ED Treatments / Results  Labs (all labs ordered are listed, but only abnormal results are displayed) Labs Reviewed - No data to display  EKG None  Radiology No results found.  Procedures .Marland KitchenLaceration Repair Date/Time: 10/08/2017 9:26 PM Performed by: Cathren Laine, MD Authorized by: Cathren Laine, MD   Anesthesia (see MAR for exact dosages):    Anesthesia method:  Local infiltration   Local anesthetic:  Lidocaine 2% WITH epi Laceration details:    Location:  Face   Face location:  Forehead   Length (cm):  3 Repair type:    Repair type:  Simple Pre-procedure details:    Preparation:  Patient was prepped and draped in usual sterile fashion Exploration:    Wound exploration: entire depth of wound probed and visualized     Contaminated: no   Treatment:    Area cleansed with:  Betadine   Amount of cleaning:  Standard   Irrigation solution:  Sterile saline   Irrigation method:  Syringe   Visualized foreign bodies/material removed: no   Skin repair:    Repair method:  Sutures   Suture size:  5-0   Suture material:  Prolene   Suture technique:  Simple interrupted   Number of sutures:   4 Post-procedure details:    Dressing:  Antibiotic ointment   Patient tolerance of procedure:  Tolerated well, no immediate complications   (including critical care time)  Medications Ordered in ED Medications  lidocaine-EPINEPHrine (XYLOCAINE W/EPI) 2 %-1:200000 (PF) injection 20 mL (has no administration in time range)     Initial Impression / Assessment and Plan / ED Course  I have reviewed the triage vital signs and the nursing notes.  Pertinent labs & imaging results that were available during my care of the patient were reviewed by me and considered in my medical decision making (see chart for details).  Wound cleaned, laceration repaired.   Reviewed nursing notes and prior charts for additional history.   No loc and mental status described as c/w baseline post fall, no vomiting, no apparent pain  or discomfort, no anticoag use - therefore do not feel would benefit from ct imaging.   Sterile dressing.     Final Clinical Impressions(s) / ED Diagnoses   Final diagnoses:  None    ED Discharge Orders    None           Cathren Laine, MD 10/08/17 2202

## 2017-10-08 NOTE — ED Notes (Signed)
Bed: WA20 Expected date:  Expected time:  Means of arrival:  Comments: EMS 84 yo female from [place] patientt/non-verbal/hematoma to head from unwitnessed fall

## 2017-11-13 ENCOUNTER — Emergency Department (HOSPITAL_COMMUNITY): Payer: Medicare Other

## 2017-11-13 ENCOUNTER — Encounter (HOSPITAL_COMMUNITY): Payer: Self-pay | Admitting: *Deleted

## 2017-11-13 ENCOUNTER — Emergency Department (HOSPITAL_COMMUNITY)
Admission: EM | Admit: 2017-11-13 | Discharge: 2017-11-13 | Disposition: A | Discharge status: Skilled Nursing Facility | Payer: Medicare Other | Attending: Emergency Medicine | Admitting: Emergency Medicine

## 2017-11-13 ENCOUNTER — Other Ambulatory Visit: Payer: Self-pay

## 2017-11-13 DIAGNOSIS — W19XXXA Unspecified fall, initial encounter: Secondary | ICD-10-CM | POA: Insufficient documentation

## 2017-11-13 DIAGNOSIS — Y939 Activity, unspecified: Secondary | ICD-10-CM | POA: Insufficient documentation

## 2017-11-13 DIAGNOSIS — Z79899 Other long term (current) drug therapy: Secondary | ICD-10-CM | POA: Insufficient documentation

## 2017-11-13 DIAGNOSIS — E119 Type 2 diabetes mellitus without complications: Secondary | ICD-10-CM | POA: Diagnosis not present

## 2017-11-13 DIAGNOSIS — G309 Alzheimer's disease, unspecified: Secondary | ICD-10-CM | POA: Diagnosis not present

## 2017-11-13 DIAGNOSIS — Z8673 Personal history of transient ischemic attack (TIA), and cerebral infarction without residual deficits: Secondary | ICD-10-CM | POA: Diagnosis not present

## 2017-11-13 DIAGNOSIS — Z7982 Long term (current) use of aspirin: Secondary | ICD-10-CM | POA: Insufficient documentation

## 2017-11-13 DIAGNOSIS — Y999 Unspecified external cause status: Secondary | ICD-10-CM | POA: Insufficient documentation

## 2017-11-13 DIAGNOSIS — S0990XA Unspecified injury of head, initial encounter: Secondary | ICD-10-CM | POA: Diagnosis present

## 2017-11-13 DIAGNOSIS — Y92129 Unspecified place in nursing home as the place of occurrence of the external cause: Secondary | ICD-10-CM | POA: Insufficient documentation

## 2017-11-13 DIAGNOSIS — S0003XA Contusion of scalp, initial encounter: Secondary | ICD-10-CM | POA: Insufficient documentation

## 2017-11-13 DIAGNOSIS — I10 Essential (primary) hypertension: Secondary | ICD-10-CM | POA: Diagnosis not present

## 2017-11-13 DIAGNOSIS — F0280 Dementia in other diseases classified elsewhere without behavioral disturbance: Secondary | ICD-10-CM | POA: Diagnosis not present

## 2017-11-13 NOTE — ED Provider Notes (Addendum)
[facility]-EMERGENCY DEPT Provider Note   CSN: [id number] Arrival date & time: 11/13/17  1628     History   Chief Complaint Chief Complaint  Patient presents with  . Fall    HPI Marilyn Mendez is a 84 y.o. female.  84 year old female with prior medical history as detailed below presents for evaluation following reported fall.  Patient had a fall earlier today with hematoma to the back of the scalp.  Patient is at her mental status baseline.  She is status post CVA with dementia.  She is nonverbal at baseline.  Patient without evidence of other trauma secondary to her fall.  Patient is a DNR.  Level 5 caveat secondary to dementia.  The history is provided by the patient, medical records and the EMS personnel.  Fall  This is a new problem. The current episode started 3 to 5 hours ago. The problem occurs every several days. The problem has not changed since onset.Pertinent negatives include no chest pain, no abdominal pain, no headaches and no shortness of breath. Nothing aggravates the symptoms. Nothing relieves the symptoms.    Past Medical History:  Diagnosis Date  . Alzheimer's dementia (HCC)   . CVA (cerebral infarction)   . Diabetes mellitus without complication (HCC)   . Hypernatremia   . Hyperosmolality   . Hypertension   . Vitamin D deficiency     There are no active problems to display for this patient.   History reviewed. No pertinent surgical history.   OB History   None      Home Medications    Prior to Admission medications   Medication Sig Start Date End Date Taking? Authorizing Provider  acetaminophen (TYLENOL) 500 MG tablet Take 500 mg by mouth every 4 (four) hours as needed for mild pain, fever or headache.    Yes [provider]  alum & mag hydroxide-simeth (MINTOX) 200-200-20 MG/5ML suspension Take 30 mLs by mouth as needed for indigestion or heartburn.    Yes [provider]  aspirin 81 MG  chewable tablet Chew 81 mg by mouth every morning.   Yes [provider]  Calcium Carb-Cholecalciferol (CALCIUM 500 +D) 500-400 MG-UNIT TABS Take 1 tablet by mouth daily with breakfast.    Yes [provider]  ferrous sulfate 220 (44 Fe) MG/5ML solution Take 330 mg by mouth at bedtime.   Yes [provider]  guaifenesin (ROBITUSSIN) 100 MG/5ML syrup Take 200 mg by mouth every 6 (six) hours as needed for cough.   Yes [provider]  hydrALAZINE (APRESOLINE) 50 MG tablet Take 50 mg by mouth 3 (three) times daily.   Yes [provider]  loperamide (IMODIUM) 2 MG capsule Take 2 mg by mouth as needed for diarrhea or loose stools.    Yes [provider]  losartan (COZAAR) 25 MG tablet Take 25 mg by mouth every morning.   Yes [provider]  magnesium hydroxide (MILK OF MAGNESIA) 400 MG/5ML suspension Take 30 mLs by mouth at bedtime as needed for mild constipation.    Yes [provider]  neomycin-bacitracin-polymyxin (NEOSPORIN) ointment Apply 1 application topically as needed for wound care.    Yes [provider]  Nutritional Supplements (NUTRITIONAL DRINK) LIQD Take 1 Can by mouth 3 (three) times daily. *Mighty Shakes*   Yes [provider]    Family History No family history on file.  Social History Social History   Tobacco Use  . Smoking status: Never Smoker  .  Smokeless tobacco: Never Used  Substance Use Topics  . Alcohol use: No  . Drug use: No     Allergies   Patient has no known allergies.   Review of Systems Review of Systems  Unable to perform ROS: Dementia  Respiratory: Negative for shortness of breath.   Cardiovascular: Negative for chest pain.  Gastrointestinal: Negative for abdominal pain.  Neurological: Negative for headaches.  All other systems reviewed and are negative.    Physical Exam Updated Vital Signs BP (!) 152/91 (BP Location: Left Arm)   Pulse 74   Temp 97.6 F  (36.4 C) (Oral)   Resp 16   SpO2 100%   Physical Exam  Constitutional: She appears well-developed and well-nourished. No distress.  HENT:  Head: Normocephalic.  Mouth/Throat: Oropharynx is clear and moist.  Hematoma noted to the posterior scalp.  No active bleeding.  Eyes: Pupils are equal, round, and reactive to light. Conjunctivae and EOM are normal.  Neck: Normal range of motion. Neck supple.  Cardiovascular: Normal rate, regular rhythm and normal heart sounds.  Pulmonary/Chest: Effort normal and breath sounds normal. No respiratory distress.  Abdominal: Soft. She exhibits no distension. There is no tenderness.  Musculoskeletal: Normal range of motion. She exhibits no edema or deformity.  Neurological: She is alert.  Alert but nonverbal    Skin: Skin is warm and dry.  Psychiatric: She has a normal mood and affect.  Nursing note and vitals reviewed.    ED Treatments / Results  Labs (all labs ordered are listed, but only abnormal results are displayed) Labs Reviewed - No data to display  EKG None  Radiology Ct Head Wo Contrast  Result Date: 11/13/2017 CLINICAL DATA:  Un witnessed fall. Hematoma to the back of the head. EXAM: CT HEAD WITHOUT CONTRAST CT CERVICAL SPINE WITHOUT CONTRAST TECHNIQUE: Multidetector CT imaging of the head and cervical spine was performed following the standard protocol without intravenous contrast. Multiplanar CT image reconstructions of the cervical spine were also generated. COMPARISON:  08/30/2017 FINDINGS: CT HEAD FINDINGS Brain: There is significant ventricular enlargement, out of proportion to sulcal widening, stable from the prior CT. Diffuse hypoattenuation is noted throughout the white matter consistent with advanced chronic microvascular ischemic change. There are no parenchymal masses or mass effect. There is no evidence an infarct. There are no extra-axial masses or abnormal fluid collections. There is no intracranial hemorrhage. Vascular:  No hyperdense vessel or unexpected calcification. Skull: Normal. Negative for fracture or focal lesion. Sinuses/Orbits: Globes and orbits are unremarkable. There are scattered areas of mild ethmoid sinus mucosal thickening. Remaining visualized sinuses and the mastoid air cells are clear. Other: Right posterior parietal scalp hematoma. CT CERVICAL SPINE FINDINGS Alignment: Normal. Skull base and vertebrae: No acute fracture. No primary bone lesion or focal pathologic process. Soft tissues and spinal canal: No prevertebral fluid or swelling. No visible canal hematoma. Disc levels: Moderate to marked loss of disc height throughout the cervical spine with endplate osteophyte formation and spondylotic disc bulging. Bilateral facet degenerative changes, greater on the left. No convincing disc herniation. Upper chest: No acute findings. No masses or adenopathy. Minor scarring at the lung apices. Other: None. IMPRESSION: HEAD CT 1. No acute findings and no change from the prior study. 2. Ventricles are enlarged out of proportion to sulcal widening. Findings suggest chronic communicating hydrocephalus. 3. Advanced chronic microvascular ischemic change. 4. No skull fracture.  Right posterior parietal scalp hematoma. CERVICAL CT 1. No fracture or acute finding. Stable appearance from the prior  study. Electronically Signed   By: Amie Portland M.D.   On: 11/13/2017 19:12   Ct Cervical Spine Wo Contrast  Result Date: 11/13/2017 CLINICAL DATA:  Un witnessed fall. Hematoma to the back of the head. EXAM: CT HEAD WITHOUT CONTRAST CT CERVICAL SPINE WITHOUT CONTRAST TECHNIQUE: Multidetector CT imaging of the head and cervical spine was performed following the standard protocol without intravenous contrast. Multiplanar CT image reconstructions of the cervical spine were also generated. COMPARISON:  08/30/2017 FINDINGS: CT HEAD FINDINGS Brain: There is significant ventricular enlargement, out of proportion to sulcal widening,  stable from the prior CT. Diffuse hypoattenuation is noted throughout the white matter consistent with advanced chronic microvascular ischemic change. There are no parenchymal masses or mass effect. There is no evidence an infarct. There are no extra-axial masses or abnormal fluid collections. There is no intracranial hemorrhage. Vascular: No hyperdense vessel or unexpected calcification. Skull: Normal. Negative for fracture or focal lesion. Sinuses/Orbits: Globes and orbits are unremarkable. There are scattered areas of mild ethmoid sinus mucosal thickening. Remaining visualized sinuses and the mastoid air cells are clear. Other: Right posterior parietal scalp hematoma. CT CERVICAL SPINE FINDINGS Alignment: Normal. Skull base and vertebrae: No acute fracture. No primary bone lesion or focal pathologic process. Soft tissues and spinal canal: No prevertebral fluid or swelling. No visible canal hematoma. Disc levels: Moderate to marked loss of disc height throughout the cervical spine with endplate osteophyte formation and spondylotic disc bulging. Bilateral facet degenerative changes, greater on the left. No convincing disc herniation. Upper chest: No acute findings. No masses or adenopathy. Minor scarring at the lung apices. Other: None. IMPRESSION: HEAD CT 1. No acute findings and no change from the prior study. 2. Ventricles are enlarged out of proportion to sulcal widening. Findings suggest chronic communicating hydrocephalus. 3. Advanced chronic microvascular ischemic change. 4. No skull fracture.  Right posterior parietal scalp hematoma. CERVICAL CT 1. No fracture or acute finding. Stable appearance from the prior study. Electronically Signed   By: Amie Portland M.D.   On: 11/13/2017 19:12    Procedures Procedures (including critical care time)  Medications Ordered in ED Medications - No data to display   Initial Impression / Assessment and Plan / ED Course  I have reviewed the triage vital signs and  the nursing notes.  Pertinent labs & imaging results that were available during my care of the patient were reviewed by me and considered in my medical decision making (see chart for details).    MDM  Screen complete  Patient is presenting for evaluation following reported fall.  Patient without evidence of significant traumatic injury found on her ED workup today.   She appears to be stable for discharge.  At time of discharge, she is at her mental status baseline.      Final Clinical Impressions(s) / ED Diagnoses   Final diagnoses:  Fall, initial encounter  Injury of head, initial encounter    ED Discharge Orders    None       Wynetta Fines, MD 11/13/17 Jerene Bears    Wynetta Fines, MD 11/13/17 [phone]

## 2017-11-13 NOTE — ED Notes (Signed)
[facility] called for transport.  

## 2017-11-13 NOTE — ED Notes (Signed)
Called [employer] twice to give report, no answer. Called pt's daughter Corrie Dandy [facility]) and left VM with updates.

## 2017-11-13 NOTE — ED Triage Notes (Signed)
Per EMS, pt from [facility].  Staff reports unwitnessed fall.  Large hematoma to back of head.  PT is alert and oriented per baselline.  Non-verbal-hx of dementia.

## 2017-11-13 NOTE — Discharge Instructions (Addendum)
Please return for any problem. Follow up with your regular physician as instructed.  °

## 2018-05-05 ENCOUNTER — Other Ambulatory Visit: Payer: Self-pay

## 2018-05-05 NOTE — Patient Outreach (Signed)
[facility] Customer service manager [facility]) Care Management  05/05/2018  Marilyn Mendez 03-Dec-1932 [id number]   Medication Adherence call to Marilyn Mendez patient is at a long term nursing home at this time patient is showing past due on Losartan 25 mg under [facility] Ins.   Lillia Abed CPhT Pharmacy Technician [facility] Care Management Direct Dial [phone]  Fax [phone] [email or web address]

## 2018-05-30 IMAGING — CR DG CHEST 2V
2 series · 2 of 2 positions shown · non-contrast
Comparison: 08/25/2015

CLINICAL DATA: Fall.  Altered mental status.

EXAM:
CHEST  2 VIEW

[x chest ap]
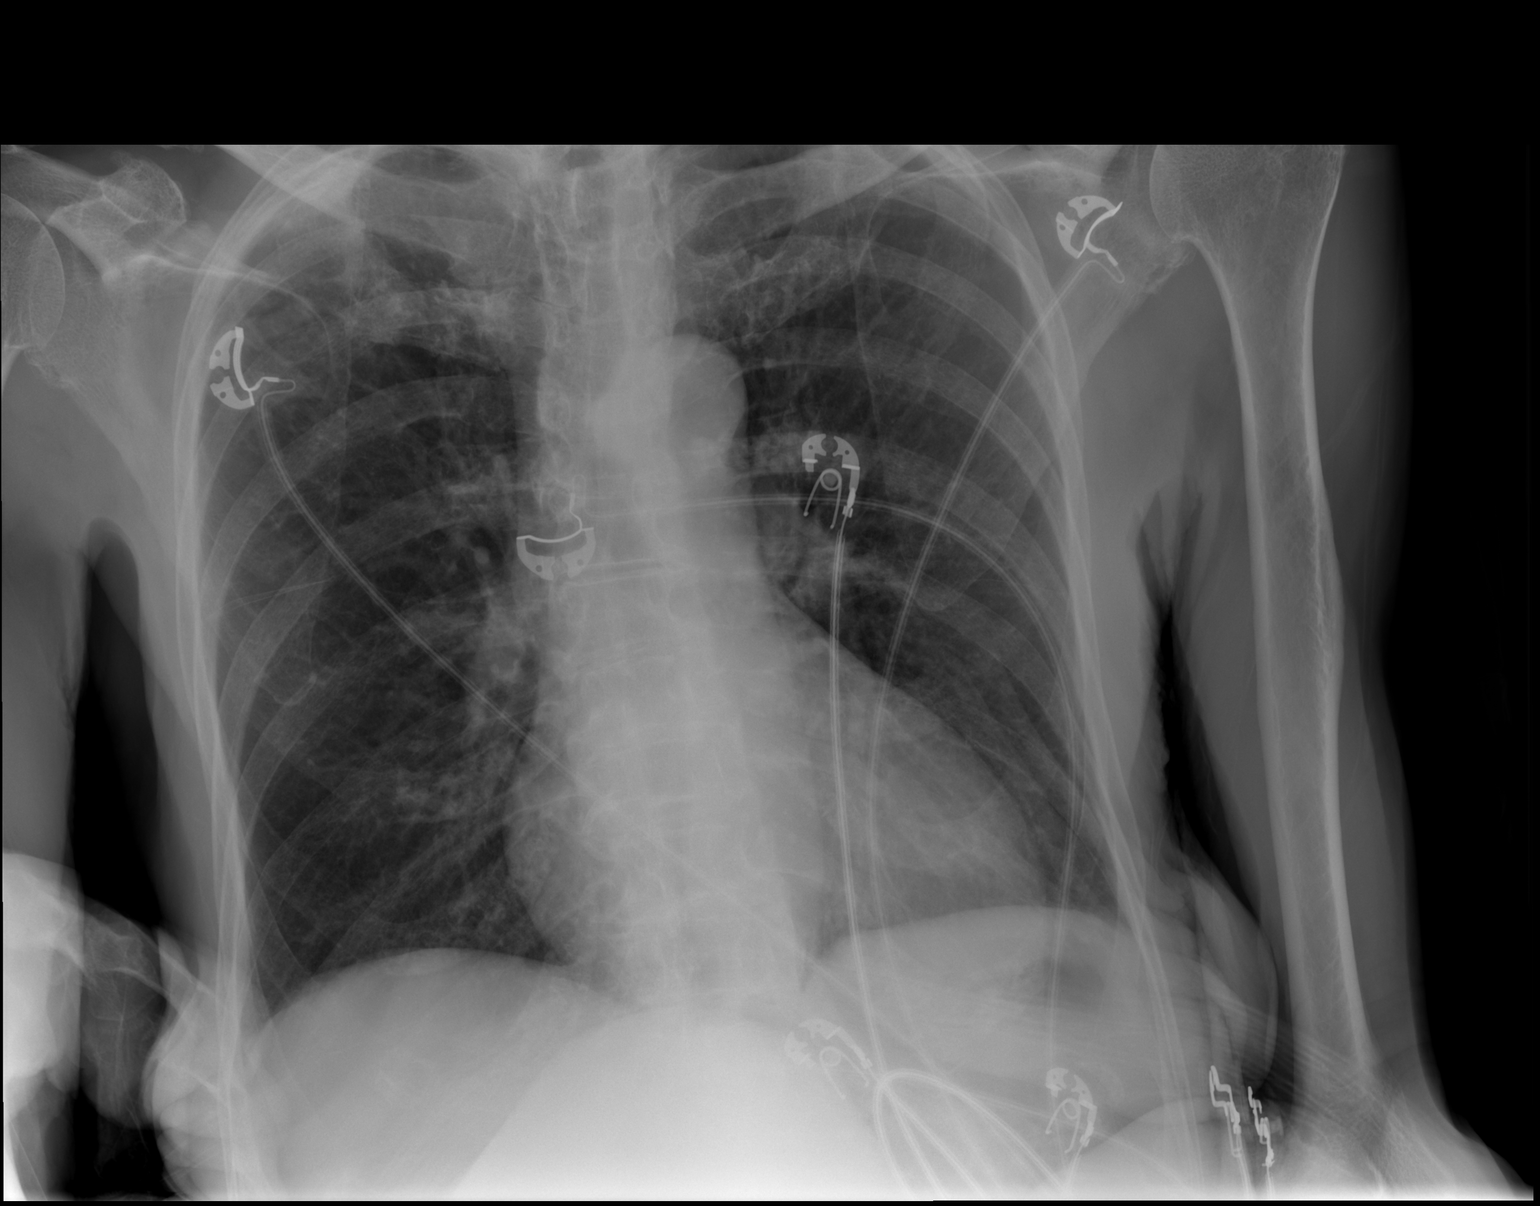

[w chest lat]
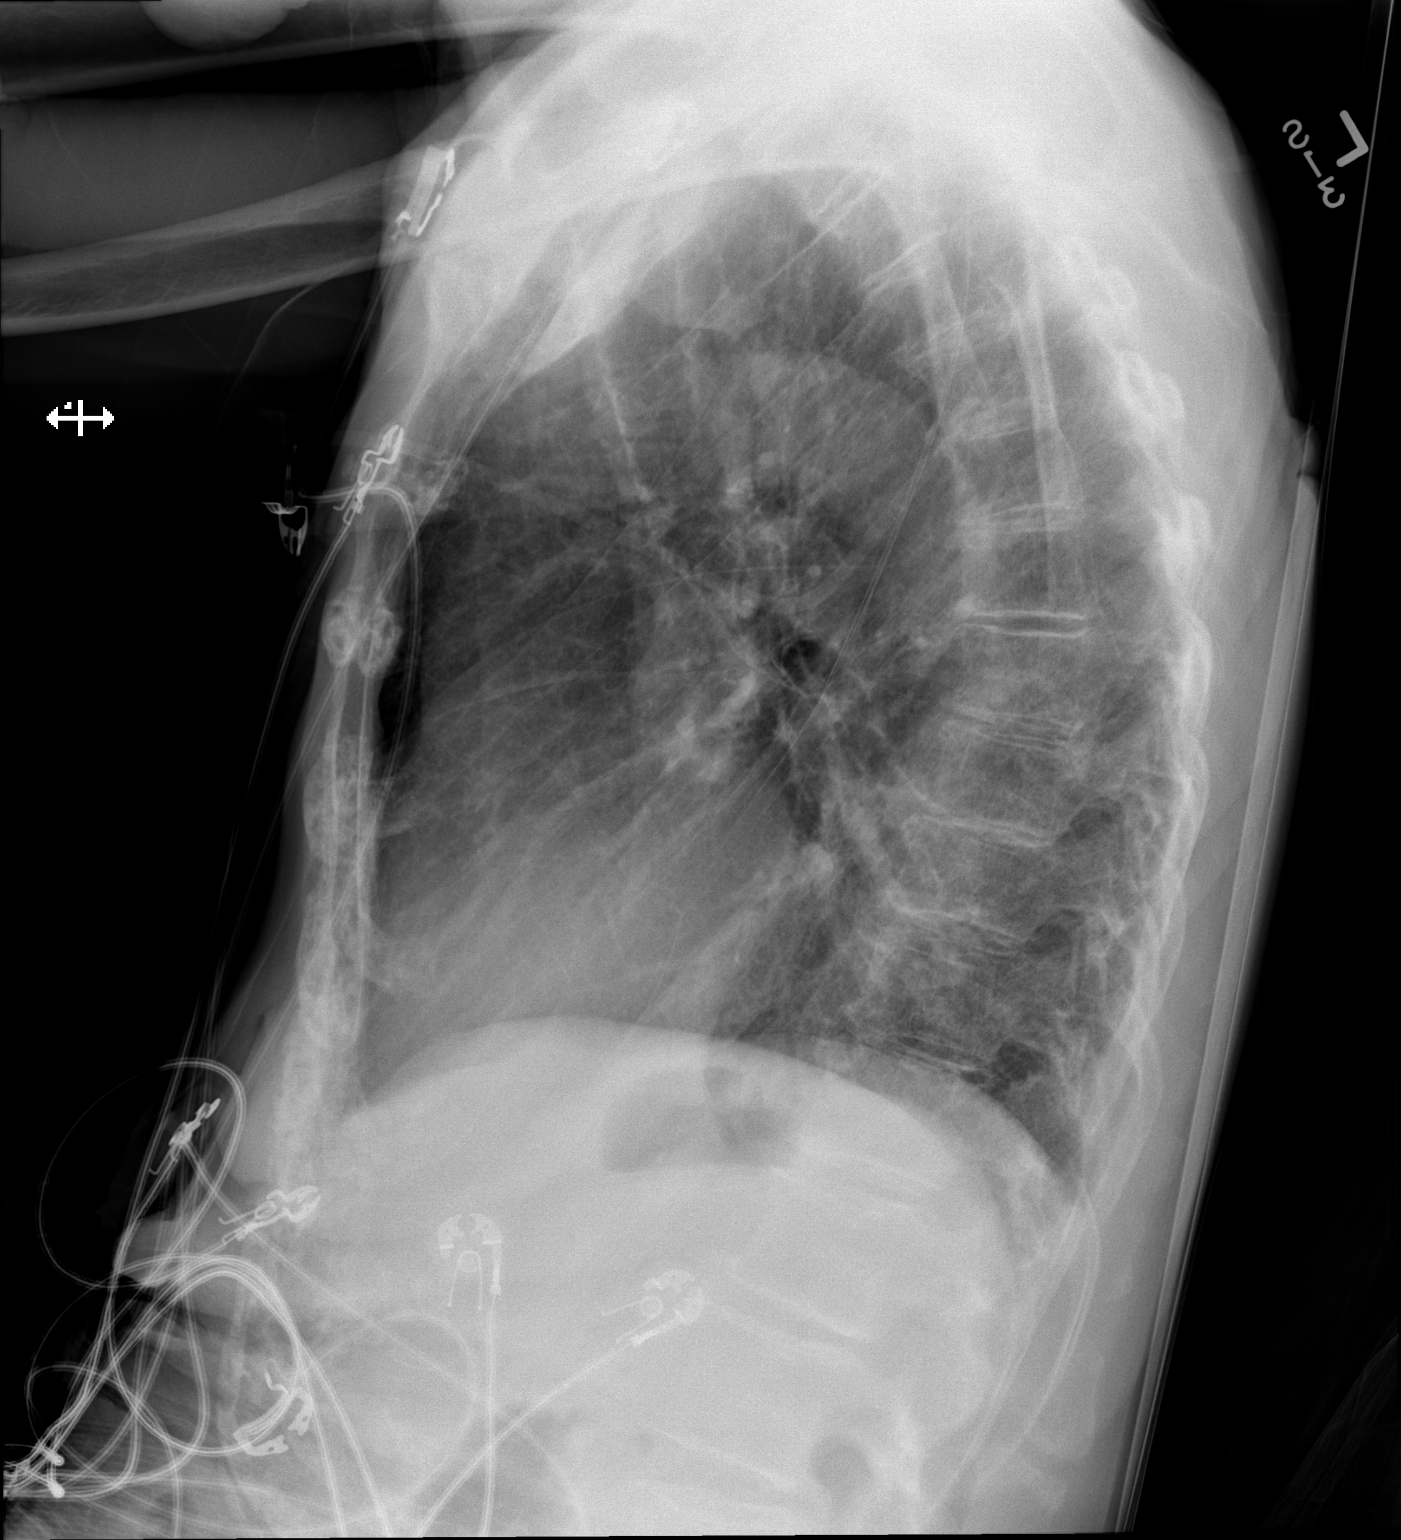

[2 of 2 positions shown; findings below may reference images not displayed]

FINDINGS: The heart is moderately enlarged. Lungs are hyperaerated. Chronic
pleural thickening at the right apex is stable. Bony changes in the
first ribs are stable. No pneumothorax. No pleural effusion. Normal
vascularity.
IMPRESSION: No active cardiopulmonary disease.

## 2018-05-30 IMAGING — CT CT HEAD W/O CM
2 of 6 series · 8 of 33 positions shown, 9 images · non-contrast
Comparison: Head and cervical spine CT scans 12/01/2015. Head CT
scan 07/11/2015.

CLINICAL DATA: Status post fall from a wheelchair today.

EXAM:
CT HEAD WITHOUT CONTRAST
CT CERVICAL SPINE WITHOUT CONTRAST
TECHNIQUE: Multidetector CT imaging of the head and cervical spine was
performed following the standard protocol without intravenous
contrast. Multiplanar CT image reconstructions of the cervical spine
were also generated.

[Series 3: bone windows · axial · 0.43mm/px · z∈[+1580,+1658]mm · 3 of 52 slices shown, 4 images]
[im 13/52  soft-tissue]
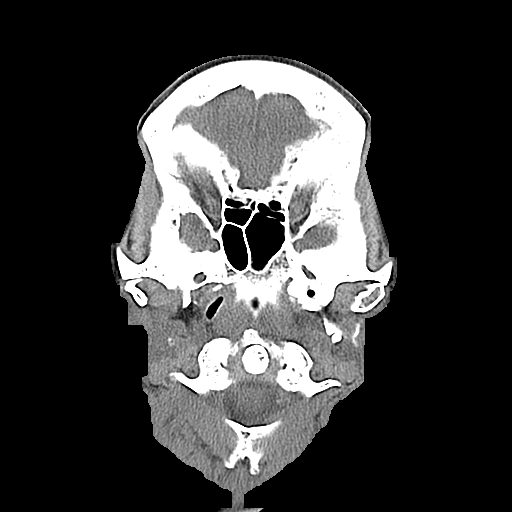
[im 13/52  bone]
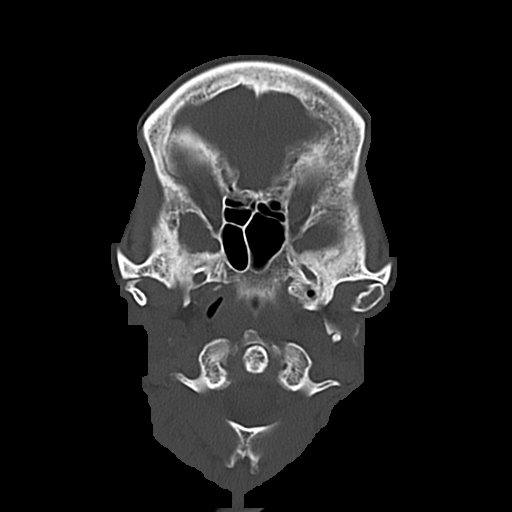
[im 26/52  soft-tissue]
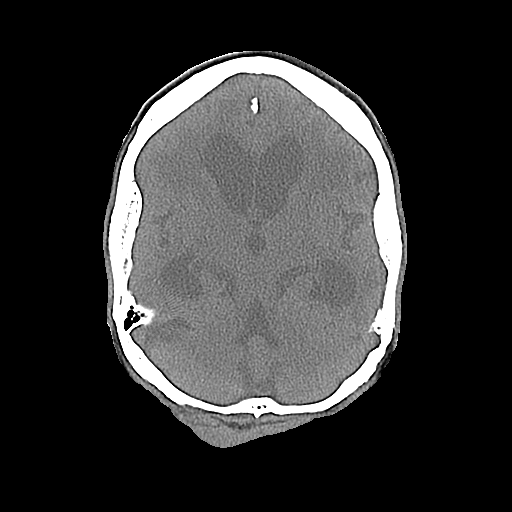
[im 39/52  soft-tissue]
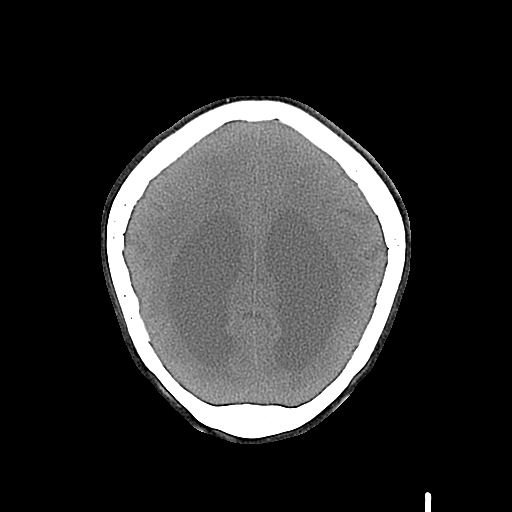

[Series 6: c-spine st · axial · 0.40mm/px · z∈[+1494,+1590]mm · 5 of 73 slices shown]
[im 13/73  soft-tissue]
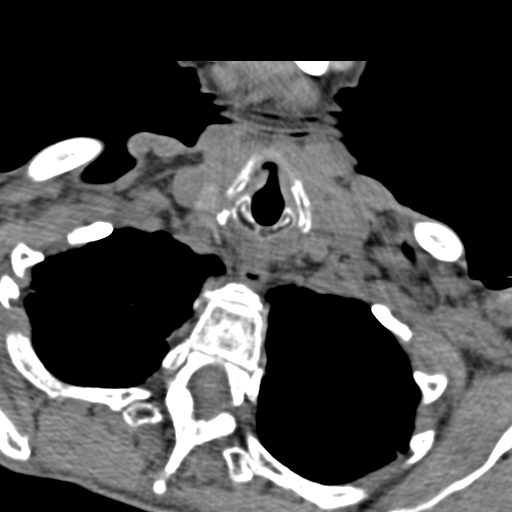
[im 25/73  soft-tissue]
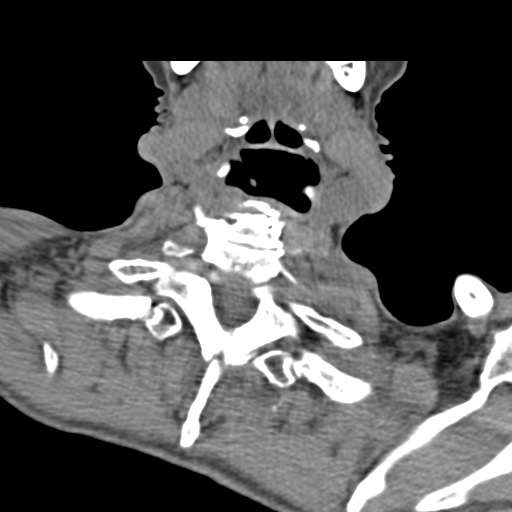
[im 37/73  soft-tissue]
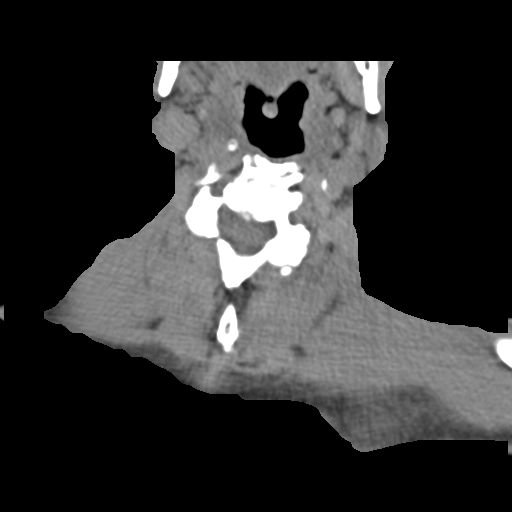
[im 49/73  soft-tissue]
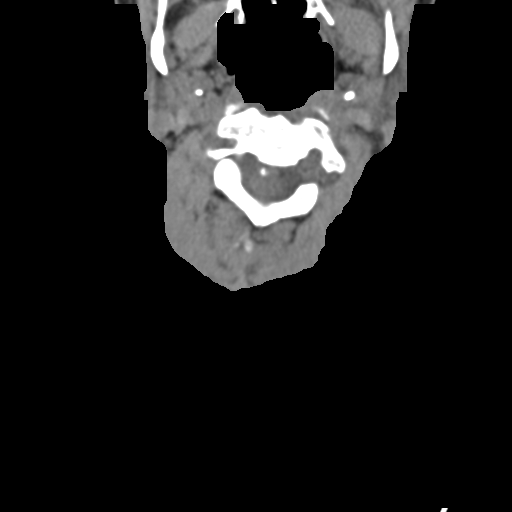
[im 61/73  soft-tissue]
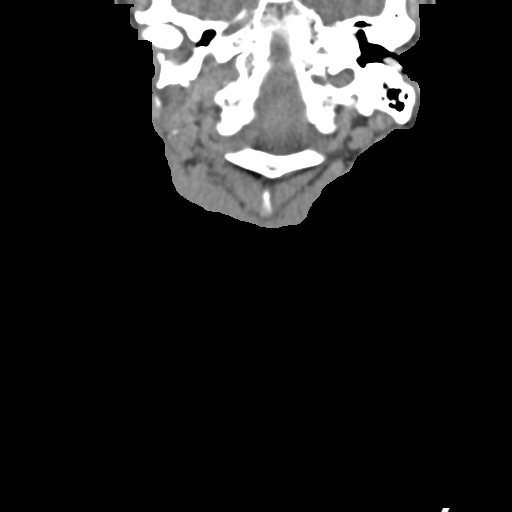

[8 of 33 positions shown; findings below may reference images not displayed]

FINDINGS: CT HEAD FINDINGS

Brain: Marked dilatation of the ventricular system is again seen.
Extensive hypoattenuation is present in the deep white matter
structures. No evidence of acute intracranial abnormality including
hemorrhage, infarct, mass lesion, mass effect, midline shift or
abnormal extra-axial fluid collection. No pneumocephalus.

Vascular: Atherosclerosis noted.

Skull: Intact.

Sinuses/Orbits: Unremarkable.

Other: None.

CT CERVICAL SPINE FINDINGS

Alignment: Straightening of lordosis is unchanged.  No listhesis.

Skull base and vertebrae: No acute fracture. No primary bone lesion
or focal pathologic process.

Soft tissues and spinal canal: No prevertebral fluid or swelling. No
visible canal hematoma.

Disc levels: Multilevel loss of disc space height and facet
arthropathy are unchanged.

Upper chest: Negative.

Other: None.
IMPRESSION: No acute abnormality head or cervical spine.

Marked dilatation of the ventricular system with an appearance
compatible with normal pressure hydrocephalus. These findings
unchanged compared the prior studies.

Cervical spondylosis.

## 2018-08-07 ENCOUNTER — Emergency Department (HOSPITAL_COMMUNITY): Payer: Medicare Other

## 2018-08-07 ENCOUNTER — Other Ambulatory Visit: Payer: Self-pay

## 2018-08-07 ENCOUNTER — Encounter (HOSPITAL_COMMUNITY): Payer: Self-pay | Admitting: Emergency Medicine

## 2018-08-07 ENCOUNTER — Emergency Department (HOSPITAL_COMMUNITY)
Admission: EM | Admit: 2018-08-07 | Discharge: 2018-08-07 | Disposition: A | Discharge status: Home / Self Care | Payer: Medicare Other | Attending: Emergency Medicine | Admitting: Emergency Medicine

## 2018-08-07 DIAGNOSIS — E119 Type 2 diabetes mellitus without complications: Secondary | ICD-10-CM | POA: Insufficient documentation

## 2018-08-07 DIAGNOSIS — Y92013 Bedroom of single-family (private) house as the place of occurrence of the external cause: Secondary | ICD-10-CM | POA: Insufficient documentation

## 2018-08-07 DIAGNOSIS — W06XXXA Fall from bed, initial encounter: Secondary | ICD-10-CM | POA: Diagnosis not present

## 2018-08-07 DIAGNOSIS — W19XXXA Unspecified fall, initial encounter: Secondary | ICD-10-CM

## 2018-08-07 DIAGNOSIS — I10 Essential (primary) hypertension: Secondary | ICD-10-CM | POA: Diagnosis not present

## 2018-08-07 DIAGNOSIS — Z7982 Long term (current) use of aspirin: Secondary | ICD-10-CM | POA: Insufficient documentation

## 2018-08-07 DIAGNOSIS — Y9389 Activity, other specified: Secondary | ICD-10-CM | POA: Diagnosis not present

## 2018-08-07 DIAGNOSIS — Z043 Encounter for examination and observation following other accident: Secondary | ICD-10-CM | POA: Diagnosis not present

## 2018-08-07 DIAGNOSIS — Z79899 Other long term (current) drug therapy: Secondary | ICD-10-CM | POA: Insufficient documentation

## 2018-08-07 DIAGNOSIS — G309 Alzheimer's disease, unspecified: Secondary | ICD-10-CM | POA: Insufficient documentation

## 2018-08-07 DIAGNOSIS — Z8673 Personal history of transient ischemic attack (TIA), and cerebral infarction without residual deficits: Secondary | ICD-10-CM | POA: Diagnosis not present

## 2018-08-07 DIAGNOSIS — Y998 Other external cause status: Secondary | ICD-10-CM | POA: Diagnosis not present

## 2018-08-07 NOTE — ED Notes (Signed)
[facility] called for transport.  

## 2018-08-07 NOTE — ED Notes (Signed)
Bed: [facility] Expected date:  Expected time:  Means of arrival:  Comments: EMS-fall 

## 2018-08-07 NOTE — ED Provider Notes (Signed)
[facility] DEPT Provider Note   CSN: [id number] Arrival date & time: 08/07/18  1158    History   Chief Complaint Chief Complaint  Patient presents with   Fall    HPI NORI WINEGAR is a 85 y.o. female.     Level 5 caveat due to dementia.  EMS states that patient was found on the floor next to her bed.  Per staff patient is at her baseline.  She is nonverbal at baseline.  Has dementia.  Not on blood thinners.  Patient with normal vitals.  Does not appear to be uncomfortable.  The history is provided by the EMS personnel.  Fall This is a new problem. The current episode started 3 to 5 hours ago. The problem has not changed since onset.Nothing aggravates the symptoms. Nothing relieves the symptoms. She has tried nothing for the symptoms. The treatment provided no relief.    Past Medical History:  Diagnosis Date   Alzheimer's dementia ([place])    CVA (cerebral infarction)    Diabetes mellitus without complication ([place])    Hypernatremia    Hyperosmolality    Hypertension    Vitamin D deficiency     There are no active problems to display for this patient.   History reviewed. No pertinent surgical history.   OB History   No obstetric history on file.      Home Medications    Prior to Admission medications   Medication Sig Start Date End Date Taking? Authorizing Provider  acetaminophen (TYLENOL) 500 MG tablet Take 500 mg by mouth every 4 (four) hours as needed for mild pain, fever or headache.    Yes [provider]  alum & mag hydroxide-simeth (MINTOX) 200-200-20 MG/5ML suspension Take 30 mLs by mouth as needed for indigestion or heartburn.    Yes [provider]  aspirin 81 MG chewable tablet Chew 81 mg by mouth every morning.   Yes [provider]  Calcium Carb-Cholecalciferol (CALCIUM 500 +D) 500-400 MG-UNIT TABS Take 1 tablet by mouth daily with breakfast.    Yes [provider]  ferrous  sulfate 220 (44 Fe) MG/5ML solution Take 330 mg by mouth at bedtime.   Yes [provider]  guaifenesin (ROBITUSSIN) 100 MG/5ML syrup Take 200 mg by mouth every 6 (six) hours as needed for cough.   Yes [provider]  hydrALAZINE (APRESOLINE) 50 MG tablet Take 50 mg by mouth 3 (three) times daily.   Yes [provider]  loperamide (IMODIUM) 2 MG capsule Take 2 mg by mouth as needed for diarrhea or loose stools.    Yes [provider]  losartan (COZAAR) 25 MG tablet Take 25 mg by mouth every morning.   Yes [provider]  magnesium hydroxide (MILK OF MAGNESIA) 400 MG/5ML suspension Take 30 mLs by mouth at bedtime as needed for mild constipation.    Yes [provider]  neomycin-bacitracin-polymyxin (NEOSPORIN) ointment Apply 1 application topically as needed for wound care.    Yes [provider]  Nutritional Supplements (NUTRITIONAL DRINK) LIQD Take 1 Can by mouth 3 (three) times daily. *Mighty Shakes*   Yes [provider]    Family History No family history on file.  Social History Social History   Tobacco Use   Smoking status: Never Smoker   Smokeless tobacco: Never Used  Substance Use Topics   Alcohol use: No   Drug use: No     Allergies   Patient has no known allergies.  Review of Systems Review of Systems  Unable to perform ROS: Dementia     Physical Exam Updated Vital Signs   ED Triage Vitals [08/07/18 1211]  Enc Vitals Group     BP (!) 151/75     Pulse Rate 70     Resp 19     Temp 98.1 F (36.7 C)     Temp Source Oral     SpO2 100 %     Weight      Height      Head Circumference      Peak Flow      Pain Score      Pain Loc      Pain Edu?      Excl. in GC?     Physical Exam Vitals signs and nursing note reviewed.  Constitutional:      General: She is not in acute distress.    Appearance: She is well-developed. She is not ill-appearing.  HENT:     Head: Normocephalic  and atraumatic.     Nose: Nose normal.     Mouth/Throat:     Mouth: Mucous membranes are moist.  Eyes:     Extraocular Movements: Extraocular movements intact.     Conjunctiva/sclera: Conjunctivae normal.     Pupils: Pupils are equal, round, and reactive to light.  Neck:     Musculoskeletal: Neck supple.  Cardiovascular:     Rate and Rhythm: Normal rate and regular rhythm.     Pulses: Normal pulses.     Heart sounds: Normal heart sounds. No murmur.  Pulmonary:     Effort: Pulmonary effort is normal. No respiratory distress.     Breath sounds: Normal breath sounds.  Abdominal:     Palpations: Abdomen is soft.     Tenderness: There is no abdominal tenderness.  Musculoskeletal:        General: No tenderness or deformity.  Skin:    General: Skin is warm and dry.  Neurological:     General: No focal deficit present.     Mental Status: She is alert.     Comments: Patient is awake, appears to move all extremities however has some rigidity in all extremities which appears baseline      ED Treatments / Results  Labs (all labs ordered are listed, but only abnormal results are displayed) Labs Reviewed - No data to display  EKG None  Radiology Ct Head Wo Contrast  Result Date: 08/07/2018 CLINICAL DATA:  Patient found down.  Unwitnessed fall. EXAM: CT HEAD WITHOUT CONTRAST CT CERVICAL SPINE WITHOUT CONTRAST TECHNIQUE: Multidetector CT imaging of the head and cervical spine was performed following the standard protocol without intravenous contrast. Multiplanar CT image reconstructions of the cervical spine were also generated. COMPARISON:  November 13, 2017 FINDINGS: CT HEAD FINDINGS Brain: No subdural, epidural, or subarachnoid hemorrhage. Ventricles are dilated but stable. Cerebellum, brainstem, and basal cisterns are unchanged. No mass effect or midline shift. Severe white matter changes are noted. No acute ischemia or infarct is noted. Vascular: No hyperdense vessel or unexpected  calcification. Skull: Normal. Negative for fracture or focal lesion. Sinuses/Orbits: No acute finding. Other: None. CT CERVICAL SPINE FINDINGS Alignment: Normal. Skull base and vertebrae: No acute fracture. No primary bone lesion or focal pathologic process. Soft tissues and spinal canal: No prevertebral fluid or swelling. No visible canal hematoma. Disc levels:  Multilevel degenerative changes. Upper chest: Negative. Other: No other abnormalities. IMPRESSION: 1. Chronic white matter changes in the brain. Chronic dilatation  of the ventricles. No acute intracranial abnormalities. 2. Severe degenerative changes in the cervical spine. No fracture or traumatic malalignment identified. Electronically Signed   By: Gerome Samavid  Williams III M.D   On: 08/07/2018 15:54   Ct Cervical Spine Wo Contrast  Result Date: 08/07/2018 CLINICAL DATA:  Patient found down.  Unwitnessed fall. EXAM: CT HEAD WITHOUT CONTRAST CT CERVICAL SPINE WITHOUT CONTRAST TECHNIQUE: Multidetector CT imaging of the head and cervical spine was performed following the standard protocol without intravenous contrast. Multiplanar CT image reconstructions of the cervical spine were also generated. COMPARISON:  November 13, 2017 FINDINGS: CT HEAD FINDINGS Brain: No subdural, epidural, or subarachnoid hemorrhage. Ventricles are dilated but stable. Cerebellum, brainstem, and basal cisterns are unchanged. No mass effect or midline shift. Severe white matter changes are noted. No acute ischemia or infarct is noted. Vascular: No hyperdense vessel or unexpected calcification. Skull: Normal. Negative for fracture or focal lesion. Sinuses/Orbits: No acute finding. Other: None. CT CERVICAL SPINE FINDINGS Alignment: Normal. Skull base and vertebrae: No acute fracture. No primary bone lesion or focal pathologic process. Soft tissues and spinal canal: No prevertebral fluid or swelling. No visible canal hematoma. Disc levels:  Multilevel degenerative changes. Upper chest:  Negative. Other: No other abnormalities. IMPRESSION: 1. Chronic white matter changes in the brain. Chronic dilatation of the ventricles. No acute intracranial abnormalities. 2. Severe degenerative changes in the cervical spine. No fracture or traumatic malalignment identified. Electronically Signed   By: Gerome Samavid  Williams III M.D   On: 08/07/2018 15:54    Procedures Procedures (including critical care time)  Medications Ordered in ED Medications - No data to display   Initial Impression / Assessment and Plan / ED Course  I have reviewed the triage vital signs and the nursing notes.  Pertinent labs & imaging results that were available during my care of the patient were reviewed by me and considered in my medical decision making (see chart for details).     Linton HamFrances L Romanek is an 85 year old female history of dementia who was brought to the ED after unwitnessed fall.  Was found next to her bed.  At her baseline.  Patient has severe dementia.  Nonverbal at baseline.  According to staff she was at her baseline before she left.  Appears that she is likely at her baseline now.  CT of her head and neck were unremarkable.  Patient is not on blood thinners.  Normal vitals.  No fever.  Likely mechanical.  Discharged from ED in good condition.  This chart was dictated using voice recognition software.  Despite best efforts to proofread,  errors can occur which can change the documentation meaning.    Final Clinical Impressions(s) / ED Diagnoses   Final diagnoses:  Fall, initial encounter    ED Discharge Orders    None       Virgina NorfolkCuratolo, Elise Gladden, DO 08/07/18 1619

## 2018-08-07 NOTE — ED Notes (Signed)
Patient transported to CT 

## 2018-08-07 NOTE — ED Triage Notes (Signed)
Per [facility] pt from [facility] for unwitnessed fall from her bed and was found on the floor. Pt is at her baseline per SNF staff. Pt not on blood thinners.  Vitals; pt comes in on NRB for O2 readings in the 80s.  134/70, HR mid 90s, CBG 170.

## 2018-08-07 NOTE — Discharge Instructions (Signed)
CT scans of the head and neck were normal.  Continue fall precautions.

## 2018-08-07 NOTE — ED Notes (Signed)
[facility] made aware of transport needed back to [facility]

## 2018-08-07 NOTE — ED Notes (Signed)
Called CT in ED and talked to [place], was told that they have one patient a head of this patient and will get to her soon as they can. Made EDP aware of delay.

## 2018-12-09 ENCOUNTER — Emergency Department (HOSPITAL_COMMUNITY): Payer: Medicare Other

## 2018-12-09 ENCOUNTER — Other Ambulatory Visit: Payer: Self-pay

## 2018-12-09 ENCOUNTER — Emergency Department (HOSPITAL_COMMUNITY)
Admission: EM | Admit: 2018-12-09 | Discharge: 2018-12-09 | Disposition: A | Discharge status: Home / Self Care | Payer: Medicare Other | Attending: Emergency Medicine | Admitting: Emergency Medicine

## 2018-12-09 ENCOUNTER — Encounter (HOSPITAL_COMMUNITY): Payer: Self-pay

## 2018-12-09 DIAGNOSIS — Y999 Unspecified external cause status: Secondary | ICD-10-CM | POA: Diagnosis not present

## 2018-12-09 DIAGNOSIS — Z23 Encounter for immunization: Secondary | ICD-10-CM | POA: Diagnosis not present

## 2018-12-09 DIAGNOSIS — F0390 Unspecified dementia without behavioral disturbance: Secondary | ICD-10-CM | POA: Insufficient documentation

## 2018-12-09 DIAGNOSIS — Y92129 Unspecified place in nursing home as the place of occurrence of the external cause: Secondary | ICD-10-CM | POA: Insufficient documentation

## 2018-12-09 DIAGNOSIS — E119 Type 2 diabetes mellitus without complications: Secondary | ICD-10-CM | POA: Diagnosis not present

## 2018-12-09 DIAGNOSIS — Z7982 Long term (current) use of aspirin: Secondary | ICD-10-CM | POA: Insufficient documentation

## 2018-12-09 DIAGNOSIS — S0990XA Unspecified injury of head, initial encounter: Secondary | ICD-10-CM | POA: Diagnosis present

## 2018-12-09 DIAGNOSIS — Y939 Activity, unspecified: Secondary | ICD-10-CM | POA: Diagnosis not present

## 2018-12-09 DIAGNOSIS — I10 Essential (primary) hypertension: Secondary | ICD-10-CM | POA: Insufficient documentation

## 2018-12-09 DIAGNOSIS — Z8673 Personal history of transient ischemic attack (TIA), and cerebral infarction without residual deficits: Secondary | ICD-10-CM | POA: Diagnosis not present

## 2018-12-09 DIAGNOSIS — Z79899 Other long term (current) drug therapy: Secondary | ICD-10-CM | POA: Insufficient documentation

## 2018-12-09 DIAGNOSIS — S0181XA Laceration without foreign body of other part of head, initial encounter: Secondary | ICD-10-CM | POA: Diagnosis not present

## 2018-12-09 DIAGNOSIS — W19XXXA Unspecified fall, initial encounter: Secondary | ICD-10-CM | POA: Diagnosis not present

## 2018-12-09 LAB — CBC WITH DIFFERENTIAL/PLATELET
Abs Immature Granulocytes: 0.02 10*3/uL (ref 0.00–0.07)
Basophils Absolute: 0 10*3/uL (ref 0.0–0.1)
Basophils Relative: 0 %
Eosinophils Absolute: 0.1 10*3/uL (ref 0.0–0.5)
Eosinophils Relative: 2 %
HCT: 30.4 % — ABNORMAL LOW (ref 36.0–46.0)
Hemoglobin: 9.1 g/dL — ABNORMAL LOW (ref 12.0–15.0)
Immature Granulocytes: 0 %
Lymphocytes Relative: 23 %
Lymphs Abs: 1.6 10*3/uL (ref 0.7–4.0)
MCH: 27 pg (ref 26.0–34.0)
MCHC: 29.9 g/dL — ABNORMAL LOW (ref 30.0–36.0)
MCV: 90.2 fL (ref 80.0–100.0)
Monocytes Absolute: 0.6 10*3/uL (ref 0.1–1.0)
Monocytes Relative: 9 %
Neutro Abs: 4.5 10*3/uL (ref 1.7–7.7)
Neutrophils Relative %: 66 %
Platelets: 260 10*3/uL (ref 150–400)
RBC: 3.37 MIL/uL — ABNORMAL LOW (ref 3.87–5.11)
RDW: 15.4 % (ref 11.5–15.5)
WBC: 6.8 10*3/uL (ref 4.0–10.5)
nRBC: 0 % (ref 0.0–0.2)

## 2018-12-09 LAB — BASIC METABOLIC PANEL
Anion gap: 10 (ref 5–15)
BUN: 29 mg/dL — ABNORMAL HIGH (ref 8–23)
CO2: 25 mmol/L (ref 22–32)
Calcium: 9.4 mg/dL (ref 8.9–10.3)
Chloride: 106 mmol/L (ref 98–111)
Creatinine, Ser: 1.48 mg/dL — ABNORMAL HIGH (ref 0.44–1.00)
GFR calc Af Amer: 37 mL/min — ABNORMAL LOW (ref >60)
GFR calc non Af Amer: 32 mL/min — ABNORMAL LOW (ref >60)
Glucose, Bld: 105 mg/dL — ABNORMAL HIGH (ref 70–99)
Potassium: 3.8 mmol/L (ref 3.5–5.1)
Sodium: 141 mmol/L (ref 135–145)

## 2018-12-09 MED ORDER — LIDOCAINE-EPINEPHRINE 2 %-1:100000 IJ SOLN
20.0000 mL | Freq: Once | INTRAMUSCULAR | Status: AC
  Administered 2018-12-09: 19:00:00 20 mL via INTRADERMAL
  Filled 2018-12-09: qty 1

## 2018-12-09 MED ORDER — TETANUS-DIPHTH-ACELL PERTUSSIS 5-2.5-18.5 LF-MCG/0.5 IM SUSP
0.5000 mL | Freq: Once | INTRAMUSCULAR | Status: AC
  Administered 2018-12-09: 0.5 mL via INTRAMUSCULAR
  Filled 2018-12-09: qty 0.5

## 2018-12-09 NOTE — ED Notes (Signed)
Patient transported to CT 

## 2018-12-09 NOTE — ED Triage Notes (Signed)
Pt BIB EMS from [facility]. Pt had an unwitnessed fall out of her bed today. Pt has lac above left eye. Bleeding controlled. No blood thinners or LOC. Pt has Alzheimer's. No other injuries noted.

## 2018-12-09 NOTE — Discharge Instructions (Signed)
Recommend suture removal in 5 to 7 days.  Please keep wound clean and dry.  Can apply topical antibiotic ointment periodically.  If she has any further falls, change in mental status, fever, return to ER for reassessment.

## 2018-12-09 NOTE — ED Provider Notes (Signed)
[facility] DEPT Provider Note   CSN: [id number] Arrival date & time: 12/09/18  1621     History   Chief Complaint Chief Complaint  Patient presents with   Fall   Laceration    HPI Marilyn Mendez is a 85 y.o. female.  Presents emergency room with chief complaint of fall.  Fall unwitnessed.  Patient has dementia.  Facility reports patient is at her mental baseline.  Noted laceration to her forehead.  They do not know any other trauma on their assessment.  Level 5 history caveat limited due to baseline dementia.     HPI  Past Medical History:  Diagnosis Date   Alzheimer's dementia ([place])    CVA (cerebral infarction)    Diabetes mellitus without complication ([place])    Hypernatremia    Hyperosmolality    Hypertension    Vitamin D deficiency     There are no active problems to display for this patient.   History reviewed. No pertinent surgical history.   OB History   No obstetric history on file.      Home Medications    Prior to Admission medications   Medication Sig Start Date End Date Taking? Authorizing Provider  acetaminophen (TYLENOL) 500 MG tablet Take 500 mg by mouth every 4 (four) hours as needed for mild pain, fever or headache.     [provider]  alum & mag hydroxide-simeth (MINTOX) 200-200-20 MG/5ML suspension Take 30 mLs by mouth as needed for indigestion or heartburn.     [provider]  aspirin 81 MG chewable tablet Chew 81 mg by mouth every morning.    [provider]  Calcium Carb-Cholecalciferol (CALCIUM 500 +D) 500-400 MG-UNIT TABS Take 1 tablet by mouth daily with breakfast.     [provider]  ferrous sulfate 220 (44 Fe) MG/5ML solution Take 330 mg by mouth at bedtime.    [provider]  guaifenesin (ROBITUSSIN) 100 MG/5ML syrup Take 200 mg by mouth every 6 (six) hours as needed for cough.    [provider]  hydrALAZINE (APRESOLINE) 50 MG tablet  Take 50 mg by mouth 3 (three) times daily.    [provider]  loperamide (IMODIUM) 2 MG capsule Take 2 mg by mouth as needed for diarrhea or loose stools.     [provider]  losartan (COZAAR) 25 MG tablet Take 25 mg by mouth every morning.    [provider]  magnesium hydroxide (MILK OF MAGNESIA) 400 MG/5ML suspension Take 30 mLs by mouth at bedtime as needed for mild constipation.     [provider]  neomycin-bacitracin-polymyxin (NEOSPORIN) ointment Apply 1 application topically as needed for wound care.     [provider]  Nutritional Supplements (NUTRITIONAL DRINK) LIQD Take 1 Can by mouth 3 (three) times daily. *Mighty Shakes*    [provider]    Family History History reviewed. No pertinent family history.  Social History Social History   Tobacco Use   Smoking status: Never Smoker   Smokeless tobacco: Never Used  Substance Use Topics   Alcohol use: No   Drug use: No     Allergies   Patient has no known allergies.   Review of Systems Review of Systems  Unable to perform ROS: Dementia     Physical Exam Updated Vital Signs There were no vitals taken for this visit.  Physical Exam Vitals signs and nursing note reviewed.  Constitutional:      General: She  is not in acute distress.    Appearance: She is well-developed.     Comments: Chronically ill-appearing  HENT:     Head: Normocephalic.     Comments: 3 cm laceration over left forehead above eyebrow, no active bleeding    Mouth/Throat:     Mouth: Mucous membranes are moist.     Pharynx: Oropharynx is clear.  Eyes:     Conjunctiva/sclera: Conjunctivae normal.  Neck:     Musculoskeletal: Neck supple.  Cardiovascular:     Rate and Rhythm: Normal rate and regular rhythm.     Heart sounds: No murmur.  Pulmonary:     Effort: Pulmonary effort is normal. No respiratory distress.     Breath sounds: Normal breath sounds.  Abdominal:     Palpations:  Abdomen is soft.     Tenderness: There is no abdominal tenderness.  Musculoskeletal:     Comments: No obvious trauma, ecchymosis, deformity noted to all 4 extremities, no focal tenderness to palpation noted  Skin:    General: Skin is warm and dry.  Neurological:     Mental Status: She is alert.     Comments: Baseline dementia      ED Treatments / Results  Labs (all labs ordered are listed, but only abnormal results are displayed) Labs Reviewed  BASIC METABOLIC PANEL  CBC WITH DIFFERENTIAL/PLATELET    EKG None  Radiology Ct Head Wo Contrast  Result Date: 12/09/2018 CLINICAL DATA:  Unwitnessed fall EXAM: CT HEAD WITHOUT CONTRAST CT CERVICAL SPINE WITHOUT CONTRAST TECHNIQUE: Multidetector CT imaging of the head and cervical spine was performed following the standard protocol without intravenous contrast. Multiplanar CT image reconstructions of the cervical spine were also generated. COMPARISON:  08/07/2018 FINDINGS: CT HEAD FINDINGS Brain: Redemonstrated gross dilation of the lateral and third ventricles with extensive periventricular and deep white matter hypodensity, unchanged compared to prior examination. Vascular: No hyperdense vessel or unexpected calcification. Skull: Normal. Negative for fracture or focal lesion. Sinuses/Orbits: No acute finding. Other: Soft tissue contusion and laceration of the forehead. CT CERVICAL SPINE FINDINGS Alignment: Normal. Skull base and vertebrae: No acute fracture. No primary bone lesion or focal pathologic process. Soft tissues and spinal canal: No prevertebral fluid or swelling. No visible canal hematoma. Disc levels: Severe multilevel disc space height loss and osteophytosis. Upper chest: Negative. Other: None. IMPRESSION: 1. Redemonstrated gross dilation of the lateral and third ventricles with extensive periventricular and deep white matter hypodensity, unchanged compared to prior examination. Findings likely reflect advanced small-vessel white  matter disease and global volume loss with ex vacuo enlargement of the ventricles, however normal pressure hydrocephalus could have this appearance. 2. Soft tissue contusion and laceration of the forehead. 3. No fracture or static subluxation of the cervical spine. Severe multilevel disc degenerative disease. Electronically Signed   By: Lauralyn Primes M.D.   On: 12/09/2018 17:27   Ct Cervical Spine Wo Contrast  Result Date: 12/09/2018 CLINICAL DATA:  Unwitnessed fall EXAM: CT HEAD WITHOUT CONTRAST CT CERVICAL SPINE WITHOUT CONTRAST TECHNIQUE: Multidetector CT imaging of the head and cervical spine was performed following the standard protocol without intravenous contrast. Multiplanar CT image reconstructions of the cervical spine were also generated. COMPARISON:  08/07/2018 FINDINGS: CT HEAD FINDINGS Brain: Redemonstrated gross dilation of the lateral and third ventricles with extensive periventricular and deep white matter hypodensity, unchanged compared to prior examination. Vascular: No hyperdense vessel or unexpected calcification. Skull: Normal. Negative for fracture or focal lesion. Sinuses/Orbits: No acute finding. Other: Soft tissue contusion and  laceration of the forehead. CT CERVICAL SPINE FINDINGS Alignment: Normal. Skull base and vertebrae: No acute fracture. No primary bone lesion or focal pathologic process. Soft tissues and spinal canal: No prevertebral fluid or swelling. No visible canal hematoma. Disc levels: Severe multilevel disc space height loss and osteophytosis. Upper chest: Negative. Other: None. IMPRESSION: 1. Redemonstrated gross dilation of the lateral and third ventricles with extensive periventricular and deep white matter hypodensity, unchanged compared to prior examination. Findings likely reflect advanced small-vessel white matter disease and global volume loss with ex vacuo enlargement of the ventricles, however normal pressure hydrocephalus could have this appearance. 2. Soft  tissue contusion and laceration of the forehead. 3. No fracture or static subluxation of the cervical spine. Severe multilevel disc degenerative disease. Electronically Signed   By: Lauralyn PrimesAlex  Bibbey M.D.   On: 12/09/2018 17:27    Procedures .Marland Kitchen.Laceration Repair  Date/Time: 12/09/2018 7:19 PM Performed by: Milagros Lollykstra, Lezly Rumpf S, MD Authorized by: Milagros Lollykstra, Jonh Mcqueary S, MD   Anesthesia (see MAR for exact dosages):    Anesthesia method:  Local infiltration   Local anesthetic:  Lidocaine 2% WITH epi Laceration details:    Location: Forehead.   Length (cm):  3 Repair type:    Repair type:  Simple Exploration:    Hemostasis achieved with:  Direct pressure   Contaminated: no   Treatment:    Area cleansed with:  Saline   Amount of cleaning:  Extensive   Irrigation solution:  Sterile saline   Visualized foreign bodies/material removed: no   Skin repair:    Repair method:  Sutures   Suture size:  5-0   Suture material:  Nylon   Number of sutures:  3 Approximation:    Approximation:  Close Post-procedure details:    Patient tolerance of procedure:  Tolerated well, no immediate complications   (including critical care time)  Medications Ordered in ED Medications  lidocaine-EPINEPHrine (XYLOCAINE W/EPI) 2 %-1:100000 (with pres) injection 20 mL (has no administration in time range)  Tdap (BOOSTRIX) injection 0.5 mL (has no administration in time range)     Initial Impression / Assessment and Plan / ED Course  I have reviewed the triage vital signs and the nursing notes.  Pertinent labs & imaging results that were available during my care of the patient were reviewed by me and considered in my medical decision making (see chart for details).        85 year old lady history of dementia presents to ER after unwitnessed fall.  Trauma exam concerning for laceration to her forehead.  No other notable findings on exam.  She remained well-appearing with stable vital signs.  Screening labs  demonstrate anemia at baseline, CKD close to baseline.  Doubt acute medical process at this time.  Performed laceration repair.  Good closure.  Will need recheck and suture removal in 5 to 7 days.  Can be done at facility.    After the discussed management above, the patient was determined to be safe for discharge.  The patient was in agreement with this plan and all questions regarding their care were answered.  ED return precautions were discussed and the patient will return to the ED with any significant worsening of condition.    Final Clinical Impressions(s) / ED Diagnoses   Final diagnoses:  Injury of head, initial encounter  Facial laceration, initial encounter    ED Discharge Orders    None       Milagros Lollykstra, Weronika Birch S, MD 12/09/18 1926

## 2018-12-09 NOTE — ED Notes (Signed)
[facility] called  

## 2018-12-10 ENCOUNTER — Encounter (HOSPITAL_COMMUNITY): Payer: Self-pay | Admitting: Emergency Medicine

## 2018-12-10 ENCOUNTER — Emergency Department (HOSPITAL_COMMUNITY)
Admission: EM | Admit: 2018-12-10 | Discharge: 2018-12-10 | Disposition: A | Discharge status: Home / Self Care | Payer: Medicare Other | Attending: Emergency Medicine | Admitting: Emergency Medicine

## 2018-12-10 ENCOUNTER — Emergency Department (HOSPITAL_COMMUNITY): Payer: Medicare Other

## 2018-12-10 DIAGNOSIS — S0083XA Contusion of other part of head, initial encounter: Secondary | ICD-10-CM | POA: Diagnosis not present

## 2018-12-10 DIAGNOSIS — S0181XA Laceration without foreign body of other part of head, initial encounter: Secondary | ICD-10-CM | POA: Diagnosis present

## 2018-12-10 DIAGNOSIS — G309 Alzheimer's disease, unspecified: Secondary | ICD-10-CM | POA: Insufficient documentation

## 2018-12-10 DIAGNOSIS — I10 Essential (primary) hypertension: Secondary | ICD-10-CM | POA: Insufficient documentation

## 2018-12-10 DIAGNOSIS — Z79899 Other long term (current) drug therapy: Secondary | ICD-10-CM | POA: Insufficient documentation

## 2018-12-10 DIAGNOSIS — Z7982 Long term (current) use of aspirin: Secondary | ICD-10-CM | POA: Diagnosis not present

## 2018-12-10 DIAGNOSIS — I119 Hypertensive heart disease without heart failure: Secondary | ICD-10-CM | POA: Insufficient documentation

## 2018-12-10 DIAGNOSIS — Y92129 Unspecified place in nursing home as the place of occurrence of the external cause: Secondary | ICD-10-CM | POA: Diagnosis not present

## 2018-12-10 DIAGNOSIS — Y939 Activity, unspecified: Secondary | ICD-10-CM | POA: Diagnosis not present

## 2018-12-10 DIAGNOSIS — W1830XA Fall on same level, unspecified, initial encounter: Secondary | ICD-10-CM | POA: Diagnosis not present

## 2018-12-10 DIAGNOSIS — W19XXXA Unspecified fall, initial encounter: Secondary | ICD-10-CM

## 2018-12-10 DIAGNOSIS — Y999 Unspecified external cause status: Secondary | ICD-10-CM | POA: Diagnosis not present

## 2018-12-10 DIAGNOSIS — R296 Repeated falls: Secondary | ICD-10-CM | POA: Insufficient documentation

## 2018-12-10 NOTE — Discharge Instructions (Signed)
You were seen in the emergency department for another fall.  Your wound had opened slightly and was closed with skin glue.  This will dissolve over time.  You had a CAT scan of your head face and cervical spine that did not show any acute findings.  Please follow-up with your doctor or return to the emergency department if any worsening symptoms.

## 2018-12-10 NOTE — ED Provider Notes (Signed)
[facility] EMERGENCY DEPARTMENT Provider Note   CSN: [id number] Arrival date & time: 12/10/18  1056     History   Chief Complaint Chief Complaint  Patient presents with   Fall    HPI Marilyn Mendez is a 85 y.o. female.  Level 5 caveat secondary to dementia.  Patient had a witnessed fall at her facility today [facility].  Patient was actually here yesterday for a fall striking her head and had sutures placed in a left forehead laceration.  She struck the same area again today with her fall.  Patient is nonverbal.  No other information available at this time.     The history is provided by the EMS personnel.  Fall This is a recurrent problem. The current episode started 1 to 2 hours ago. The problem has not changed since onset.She has tried nothing for the symptoms. The treatment provided no relief.    Past Medical History:  Diagnosis Date   Alzheimer's dementia (HCC)    CVA (cerebral infarction)    Diabetes mellitus without complication (HCC)    Hypernatremia    Hyperosmolality    Hypertension    Vitamin D deficiency     There are no active problems to display for this patient.   History reviewed. No pertinent surgical history.   OB History   No obstetric history on file.      Home Medications    Prior to Admission medications   Medication Sig Start Date End Date Taking? Authorizing Provider  acetaminophen (TYLENOL) 500 MG tablet Take 500 mg by mouth every 4 (four) hours as needed for mild pain, fever or headache.     [provider]  alum & mag hydroxide-simeth (MINTOX) 200-200-20 MG/5ML suspension Take 30 mLs by mouth as needed for indigestion or heartburn.     [provider]  aspirin 81 MG chewable tablet Chew 81 mg by mouth every morning.    [provider]  Calcium Carb-Cholecalciferol (CALCIUM 500 +D) 500-400 MG-UNIT TABS Take 1 tablet by mouth daily with breakfast.     [provider]    ferrous sulfate 220 (44 Fe) MG/5ML solution Take 330 mg by mouth at bedtime.    [provider]  guaifenesin (ROBITUSSIN) 100 MG/5ML syrup Take 200 mg by mouth every 6 (six) hours as needed for cough.    [provider]  hydrALAZINE (APRESOLINE) 50 MG tablet Take 50 mg by mouth 3 (three) times daily.    [provider]  loperamide (IMODIUM) 2 MG capsule Take 2 mg by mouth as needed for diarrhea or loose stools.     [provider]  losartan (COZAAR) 25 MG tablet Take 25 mg by mouth every morning.    [provider]  magnesium hydroxide (MILK OF MAGNESIA) 400 MG/5ML suspension Take 30 mLs by mouth at bedtime as needed for mild constipation.     [provider]  neomycin-bacitracin-polymyxin (NEOSPORIN) ointment Apply 1 application topically as needed for wound care.     [provider]  Nutritional Supplements (NUTRITIONAL DRINK) LIQD Take 1 Can by mouth 3 (three) times daily. *Mighty Shakes*    [provider]    Family History History reviewed. No pertinent family history.  Social History Social History   Tobacco Use   Smoking status: Never Smoker   Smokeless tobacco: Never Used  Substance Use Topics   Alcohol use: No   Drug use: No     Allergies   Patient  has no known allergies.   Review of Systems Review of Systems  Unable to perform ROS: Dementia     Physical Exam Updated Vital Signs BP 138/73    Pulse 68    Temp 97.8 F (36.6 C) (Oral)    Resp 17    SpO2 99%   Physical Exam Vitals signs and nursing note reviewed.  Constitutional:      General: She is not in acute distress.    Appearance: She is well-developed.  HENT:     Head: Normocephalic.     Comments: She has a hematoma above her left eyebrow with a laceration approximately 2 cm.  There appears to be some disruption of may be a few of the stitches. Eyes:     Conjunctiva/sclera: Conjunctivae normal.  Neck:     Musculoskeletal: Neck  supple.  Cardiovascular:     Rate and Rhythm: Normal rate and regular rhythm.     Heart sounds: No murmur.  Pulmonary:     Effort: Pulmonary effort is normal. No respiratory distress.     Breath sounds: Normal breath sounds.  Abdominal:     Palpations: Abdomen is soft.     Tenderness: There is no abdominal tenderness.  Musculoskeletal:        General: No deformity or signs of injury.  Skin:    General: Skin is warm and dry.     Capillary Refill: Capillary refill takes less than 2 seconds.  Neurological:     Mental Status: She is alert. Mental status is at baseline.     GCS: GCS eye subscore is 4. GCS verbal subscore is 5. GCS motor subscore is 6.     Comments: Patient is awake.  Somewhat following commands.  Moving nonfocally.  Nonverbal.      ED Treatments / Results  Labs (all labs ordered are listed, but only abnormal results are displayed) Labs Reviewed  URINE CULTURE  URINALYSIS, ROUTINE W REFLEX MICROSCOPIC    EKG None  Radiology Dg Chest 1 View  Result Date: 12/09/2018 CLINICAL DATA:  Fall, chest pain EXAM: CHEST  1 VIEW COMPARISON:  None. FINDINGS: Study limited by positioning. The patient's head obscures much of the right upper hemithorax. Heart is borderline in size. No visible confluent opacities or effusions. No visible pneumothorax or acute bony abnormality. IMPRESSION: Limited study by positioning as above. No acute cardiopulmonary disease. Electronically Signed   By: Rolm Baptise M.D.   On: 12/09/2018 19:04   Dg Pelvis 1-2 Views  Result Date: 12/09/2018 CLINICAL DATA:  Fall with pain EXAM: PELVIS - 1-2 VIEW COMPARISON:  11/10/2016 FINDINGS: Nonstandard view of the pelvis hinders evaluation. The femoral heads appear to project in joint. IMPRESSION: Limited due to positioning. If pelvic fracture remains a concern, CT would be advised Electronically Signed   By: Donavan Foil M.D.   On: 12/09/2018 19:08   Ct Head Wo Contrast  Result Date: 12/10/2018 CLINICAL  DATA:  Fall EXAM: CT HEAD WITHOUT CONTRAST CT MAXILLOFACIAL WITHOUT CONTRAST CT CERVICAL SPINE WITHOUT CONTRAST TECHNIQUE: Multidetector CT imaging of the head, cervical spine, and maxillofacial structures were performed using the standard protocol without intravenous contrast. Multiplanar CT image reconstructions of the cervical spine and maxillofacial structures were also generated. COMPARISON:  12/09/2018 FINDINGS: CT HEAD FINDINGS Brain: Redemonstrated gross dilatation of the lateral and third ventricles with extensive periventricular and deep white matter hypodensity, unchanged. Vascular: No hyperdense vessel or unexpected calcification. CT FACIAL BONES FINDINGS Skull: Normal. Negative for fracture or focal lesion.  Facial bones: No displaced fractures or dislocations. Sinuses/Orbits: No acute finding. Other: Large hematoma and laceration of the forehead is increased in size compared to prior examination. CT CERVICAL SPINE FINDINGS Alignment: Normal. Skull base and vertebrae: No acute fracture. No primary bone lesion or focal pathologic process. Soft tissues and spinal canal: No prevertebral fluid or swelling. No visible canal hematoma. Disc levels: Severe multilevel disc space height loss and osteophytosis. Upper chest: Negative. Other: None. IMPRESSION: 1.  No acute intracranial pathology. 2. Redemonstrated gross dilatation of the lateral and third ventricles with extensive periventricular and deep white matter hypodensity, unchanged. Although likely due to small-vessel white matter disease and global volume loss with enlargement of the ventricles ex vacuo, this appearance can be seen in normal pressure hydrocephalus. 3.  No displaced fracture or dislocation of the facial bones. 4. Large hematoma and laceration of the forehead is increased in size compared to prior examination. 5. No fracture or static subluxation of the cervical spine. Severe multilevel disc degenerative disease. Electronically Signed   By:  Lauralyn PrimesAlex  Bibbey M.D.   On: 12/10/2018 14:21   Ct Head Wo Contrast  Result Date: 12/09/2018 CLINICAL DATA:  Unwitnessed fall EXAM: CT HEAD WITHOUT CONTRAST CT CERVICAL SPINE WITHOUT CONTRAST TECHNIQUE: Multidetector CT imaging of the head and cervical spine was performed following the standard protocol without intravenous contrast. Multiplanar CT image reconstructions of the cervical spine were also generated. COMPARISON:  08/07/2018 FINDINGS: CT HEAD FINDINGS Brain: Redemonstrated gross dilation of the lateral and third ventricles with extensive periventricular and deep white matter hypodensity, unchanged compared to prior examination. Vascular: No hyperdense vessel or unexpected calcification. Skull: Normal. Negative for fracture or focal lesion. Sinuses/Orbits: No acute finding. Other: Soft tissue contusion and laceration of the forehead. CT CERVICAL SPINE FINDINGS Alignment: Normal. Skull base and vertebrae: No acute fracture. No primary bone lesion or focal pathologic process. Soft tissues and spinal canal: No prevertebral fluid or swelling. No visible canal hematoma. Disc levels: Severe multilevel disc space height loss and osteophytosis. Upper chest: Negative. Other: None. IMPRESSION: 1. Redemonstrated gross dilation of the lateral and third ventricles with extensive periventricular and deep white matter hypodensity, unchanged compared to prior examination. Findings likely reflect advanced small-vessel white matter disease and global volume loss with ex vacuo enlargement of the ventricles, however normal pressure hydrocephalus could have this appearance. 2. Soft tissue contusion and laceration of the forehead. 3. No fracture or static subluxation of the cervical spine. Severe multilevel disc degenerative disease. Electronically Signed   By: Lauralyn PrimesAlex  Bibbey M.D.   On: 12/09/2018 17:27   Ct Cervical Spine Wo Contrast  Result Date: 12/10/2018 CLINICAL DATA:  Fall EXAM: CT HEAD WITHOUT CONTRAST CT MAXILLOFACIAL  WITHOUT CONTRAST CT CERVICAL SPINE WITHOUT CONTRAST TECHNIQUE: Multidetector CT imaging of the head, cervical spine, and maxillofacial structures were performed using the standard protocol without intravenous contrast. Multiplanar CT image reconstructions of the cervical spine and maxillofacial structures were also generated. COMPARISON:  12/09/2018 FINDINGS: CT HEAD FINDINGS Brain: Redemonstrated gross dilatation of the lateral and third ventricles with extensive periventricular and deep white matter hypodensity, unchanged. Vascular: No hyperdense vessel or unexpected calcification. CT FACIAL BONES FINDINGS Skull: Normal. Negative for fracture or focal lesion. Facial bones: No displaced fractures or dislocations. Sinuses/Orbits: No acute finding. Other: Large hematoma and laceration of the forehead is increased in size compared to prior examination. CT CERVICAL SPINE FINDINGS Alignment: Normal. Skull base and vertebrae: No acute fracture. No primary bone lesion or focal pathologic process. Soft tissues  and spinal canal: No prevertebral fluid or swelling. No visible canal hematoma. Disc levels: Severe multilevel disc space height loss and osteophytosis. Upper chest: Negative. Other: None. IMPRESSION: 1.  No acute intracranial pathology. 2. Redemonstrated gross dilatation of the lateral and third ventricles with extensive periventricular and deep white matter hypodensity, unchanged. Although likely due to small-vessel white matter disease and global volume loss with enlargement of the ventricles ex vacuo, this appearance can be seen in normal pressure hydrocephalus. 3.  No displaced fracture or dislocation of the facial bones. 4. Large hematoma and laceration of the forehead is increased in size compared to prior examination. 5. No fracture or static subluxation of the cervical spine. Severe multilevel disc degenerative disease. Electronically Signed   By: Lauralyn Primes M.D.   On: 12/10/2018 14:21   Ct Cervical  Spine Wo Contrast  Result Date: 12/09/2018 CLINICAL DATA:  Unwitnessed fall EXAM: CT HEAD WITHOUT CONTRAST CT CERVICAL SPINE WITHOUT CONTRAST TECHNIQUE: Multidetector CT imaging of the head and cervical spine was performed following the standard protocol without intravenous contrast. Multiplanar CT image reconstructions of the cervical spine were also generated. COMPARISON:  08/07/2018 FINDINGS: CT HEAD FINDINGS Brain: Redemonstrated gross dilation of the lateral and third ventricles with extensive periventricular and deep white matter hypodensity, unchanged compared to prior examination. Vascular: No hyperdense vessel or unexpected calcification. Skull: Normal. Negative for fracture or focal lesion. Sinuses/Orbits: No acute finding. Other: Soft tissue contusion and laceration of the forehead. CT CERVICAL SPINE FINDINGS Alignment: Normal. Skull base and vertebrae: No acute fracture. No primary bone lesion or focal pathologic process. Soft tissues and spinal canal: No prevertebral fluid or swelling. No visible canal hematoma. Disc levels: Severe multilevel disc space height loss and osteophytosis. Upper chest: Negative. Other: None. IMPRESSION: 1. Redemonstrated gross dilation of the lateral and third ventricles with extensive periventricular and deep white matter hypodensity, unchanged compared to prior examination. Findings likely reflect advanced small-vessel white matter disease and global volume loss with ex vacuo enlargement of the ventricles, however normal pressure hydrocephalus could have this appearance. 2. Soft tissue contusion and laceration of the forehead. 3. No fracture or static subluxation of the cervical spine. Severe multilevel disc degenerative disease. Electronically Signed   By: Lauralyn Primes M.D.   On: 12/09/2018 17:27   Ct Maxillofacial Wo Contrast  Result Date: 12/10/2018 CLINICAL DATA:  Fall EXAM: CT HEAD WITHOUT CONTRAST CT MAXILLOFACIAL WITHOUT CONTRAST CT CERVICAL SPINE WITHOUT  CONTRAST TECHNIQUE: Multidetector CT imaging of the head, cervical spine, and maxillofacial structures were performed using the standard protocol without intravenous contrast. Multiplanar CT image reconstructions of the cervical spine and maxillofacial structures were also generated. COMPARISON:  12/09/2018 FINDINGS: CT HEAD FINDINGS Brain: Redemonstrated gross dilatation of the lateral and third ventricles with extensive periventricular and deep white matter hypodensity, unchanged. Vascular: No hyperdense vessel or unexpected calcification. CT FACIAL BONES FINDINGS Skull: Normal. Negative for fracture or focal lesion. Facial bones: No displaced fractures or dislocations. Sinuses/Orbits: No acute finding. Other: Large hematoma and laceration of the forehead is increased in size compared to prior examination. CT CERVICAL SPINE FINDINGS Alignment: Normal. Skull base and vertebrae: No acute fracture. No primary bone lesion or focal pathologic process. Soft tissues and spinal canal: No prevertebral fluid or swelling. No visible canal hematoma. Disc levels: Severe multilevel disc space height loss and osteophytosis. Upper chest: Negative. Other: None. IMPRESSION: 1.  No acute intracranial pathology. 2. Redemonstrated gross dilatation of the lateral and third ventricles with extensive periventricular and deep white  matter hypodensity, unchanged. Although likely due to small-vessel white matter disease and global volume loss with enlargement of the ventricles ex vacuo, this appearance can be seen in normal pressure hydrocephalus. 3.  No displaced fracture or dislocation of the facial bones. 4. Large hematoma and laceration of the forehead is increased in size compared to prior examination. 5. No fracture or static subluxation of the cervical spine. Severe multilevel disc degenerative disease. Electronically Signed   By: Lauralyn Primes M.D.   On: 12/10/2018 14:21    Procedures .Marland KitchenLaceration Repair  Date/Time: 12/10/2018  2:24 PM Performed by: Terrilee Files, MD Authorized by: Terrilee Files, MD   Consent:    Risks discussed:  Infection, pain, poor cosmetic result, poor wound healing and retained foreign body   Alternatives discussed:  No treatment and delayed treatment Laceration details:    Location:  Face   Face location:  Forehead   Length (cm):  2 Repair type:    Repair type:  Simple Treatment:    Area cleansed with:  Saline Skin repair:    Repair method:  Tissue adhesive Approximation:    Approximation:  Close Post-procedure details:    Dressing:  Open (no dressing)   Patient tolerance of procedure:  Tolerated well, no immediate complications   (including critical care time)  Medications Ordered in ED Medications - No data to display   Initial Impression / Assessment and Plan / ED Course  I have reviewed the triage vital signs and the nursing notes.  Pertinent labs & imaging results that were available during my care of the patient were reviewed by me and considered in my medical decision making (see chart for details).  Clinical Course as of Dec 10 1902  Thu Dec 10, 2018  1976 85 year old with dementia, frequent falls here after a fall again.  Seen yesterday for same and suture repaired laceration forehead.  Differential includes skull fracture, facial fracture, intercerebral bleed, contusion.  The wound itself has not open greatly and I think the area can be Dermabonded.   [MB]  1416 She had labs done yesterday that were unremarkable.  We will check a urine.   [MB]  1425 Patient imaging showing no acute changes other than a large hematoma.   [MB]    Clinical Course User Index [MB] Terrilee Files, MD       Final Clinical Impressions(s) / ED Diagnoses   Final diagnoses:  Fall, initial encounter  Contusion of face, initial encounter  Laceration of forehead, initial encounter    ED Discharge Orders    None       Terrilee Files, MD 12/10/18 1904

## 2018-12-10 NOTE — ED Triage Notes (Signed)
Pt had a witnessed fall from a nursing facility ([facility]). No LOC. PT here yesterday for the same thing. Pt his the same spot that she hit yesterday. Had stitches from yesterday- those are busted open. Pt is nonverbal with dementia. C collar in place. Pt is not on blood thinners. Abrasion on left ear with dried blood. BP 102/62, HR 64, resp 14 97% , CBG 140

## 2018-12-10 NOTE — ED Notes (Signed)
Attempted to call facility and received no answer. PT[facility] here to transport pt back to facility. DC instructions and paperwork given to PTAR.

## 2018-12-10 NOTE — Progress Notes (Signed)
Orthopedic Tech Progress Note Patient Details:  Marilyn Mendez [birth year] [id number]  Ortho Devices Ortho Device/Splint Location: level 2 trauma       [name] 12/10/2018, 6:40 PM

## 2019-01-27 ENCOUNTER — Encounter (HOSPITAL_COMMUNITY): Payer: Self-pay

## 2019-01-27 ENCOUNTER — Emergency Department (HOSPITAL_COMMUNITY)
Admission: EM | Admit: 2019-01-27 | Discharge: 2019-01-27 | Disposition: A | Discharge status: Home / Self Care | Payer: Medicare Other | Attending: Emergency Medicine | Admitting: Emergency Medicine

## 2019-01-27 ENCOUNTER — Emergency Department (HOSPITAL_COMMUNITY): Payer: Medicare Other

## 2019-01-27 ENCOUNTER — Other Ambulatory Visit: Payer: Self-pay

## 2019-01-27 DIAGNOSIS — R569 Unspecified convulsions: Secondary | ICD-10-CM | POA: Insufficient documentation

## 2019-01-27 DIAGNOSIS — R7989 Other specified abnormal findings of blood chemistry: Secondary | ICD-10-CM | POA: Insufficient documentation

## 2019-01-27 DIAGNOSIS — K839 Disease of biliary tract, unspecified: Secondary | ICD-10-CM | POA: Insufficient documentation

## 2019-01-27 LAB — COMPREHENSIVE METABOLIC PANEL
ALT: 321 U/L — ABNORMAL HIGH (ref 0–44)
AST: 313 U/L — ABNORMAL HIGH (ref 15–41)
Albumin: 3.4 g/dL — ABNORMAL LOW (ref 3.5–5.0)
Alkaline Phosphatase: 137 U/L — ABNORMAL HIGH (ref 38–126)
Anion gap: 15 (ref 5–15)
BUN: 27 mg/dL — ABNORMAL HIGH (ref 8–23)
CO2: 19 mmol/L — ABNORMAL LOW (ref 22–32)
Calcium: 9.6 mg/dL (ref 8.9–10.3)
Chloride: 109 mmol/L (ref 98–111)
Creatinine, Ser: 1.52 mg/dL — ABNORMAL HIGH (ref 0.44–1.00)
GFR calc Af Amer: 36 mL/min — ABNORMAL LOW (ref >60)
GFR calc non Af Amer: 31 mL/min — ABNORMAL LOW (ref >60)
Glucose, Bld: 145 mg/dL — ABNORMAL HIGH (ref 70–99)
Potassium: 3.8 mmol/L (ref 3.5–5.1)
Sodium: 143 mmol/L (ref 135–145)
Total Bilirubin: 0.7 mg/dL (ref 0.3–1.2)
Total Protein: 7.9 g/dL (ref 6.5–8.1)

## 2019-01-27 LAB — CBC WITH DIFFERENTIAL/PLATELET
Abs Immature Granulocytes: 0.04 10*3/uL (ref 0.00–0.07)
Basophils Absolute: 0 10*3/uL (ref 0.0–0.1)
Basophils Relative: 0 %
Eosinophils Absolute: 0 10*3/uL (ref 0.0–0.5)
Eosinophils Relative: 0 %
HCT: 30.3 % — ABNORMAL LOW (ref 36.0–46.0)
Hemoglobin: 9 g/dL — ABNORMAL LOW (ref 12.0–15.0)
Immature Granulocytes: 0 %
Lymphocytes Relative: 5 %
Lymphs Abs: 0.6 10*3/uL — ABNORMAL LOW (ref 0.7–4.0)
MCH: 26.6 pg (ref 26.0–34.0)
MCHC: 29.7 g/dL — ABNORMAL LOW (ref 30.0–36.0)
MCV: 89.6 fL (ref 80.0–100.0)
Monocytes Absolute: 0.7 10*3/uL (ref 0.1–1.0)
Monocytes Relative: 6 %
Neutro Abs: 9 10*3/uL — ABNORMAL HIGH (ref 1.7–7.7)
Neutrophils Relative %: 89 %
Platelets: 244 10*3/uL (ref 150–400)
RBC: 3.38 MIL/uL — ABNORMAL LOW (ref 3.87–5.11)
RDW: 15.9 % — ABNORMAL HIGH (ref 11.5–15.5)
WBC: 10.3 10*3/uL (ref 4.0–10.5)
nRBC: 0 % (ref 0.0–0.2)

## 2019-01-27 LAB — CBG MONITORING, ED: Glucose-Capillary: 132 mg/dL — ABNORMAL HIGH (ref 70–99)

## 2019-01-27 MED ORDER — LEVETIRACETAM IN NACL 1000 MG/100ML IV SOLN
1000.0000 mg | Freq: Once | INTRAVENOUS | Status: AC
  Administered 2019-01-27: 1000 mg via INTRAVENOUS
  Filled 2019-01-27: qty 100

## 2019-01-27 MED ORDER — IOHEXOL 350 MG/ML SOLN
80.0000 mL | Freq: Once | INTRAVENOUS | Status: AC | PRN
  Administered 2019-01-27: 80 mL via INTRAVENOUS

## 2019-01-27 MED ORDER — LEVETIRACETAM 250 MG PO TABS: 250.0000 mg | ORAL_TABLET | Freq: Two times a day (BID) | ORAL | 0 refills | Status: AC

## 2019-01-27 NOTE — ED Triage Notes (Signed)
Pt from [facility] via ems; sitting in wheelchair, had approx 3 min full body seizure per staff; no hx of same; pt lowered to floor by staff; non verbal at baseline, doesn't follow commands, dementia; will open eyes and look around; bite mark to lower lip  118/86 108 P 99% RA CBG 171 RR 18

## 2019-01-27 NOTE — ED Notes (Signed)
Help get patient undress on the monitor did ekg shown to Dr Tyrone Nine

## 2019-01-27 NOTE — ED Provider Notes (Signed)
[facility]   CSN: [id number] Arrival date & time: 01/27/19  0830     History Chief Complaint  Patient presents with  . Seizures    Marilyn Mendez is a 85 y.o. female.  85 yo F with a chief complaints of generalized shaking.  This lasted for about 3 minutes and then resolved.  The nursing home thought maybe she bit her lip but denied any other injuries.  She was in a chair and was gently laid to the ground.  She is currently back to her baseline.  She is nonverbal and is immobile.  Level 5 caveat nonverbal.  The history is provided by the patient.  Seizures Seizure activity on arrival: no   Seizure type:  Grand mal Initial focality:  None Episode characteristics: abnormal movements   Postictal symptoms: no confusion, no memory loss and no somnolence   Return to baseline: yes   Severity:  Mild Duration:  3 minutes Timing:  Once Number of seizures this episode:  1 Progression:  Resolved Recent head injury:  No recent head injuries PTA treatment:  None History of seizures: no        Past Medical History:  Diagnosis Date  . Alzheimer's dementia (HCC)   . CVA (cerebral infarction)   . Diabetes mellitus without complication (HCC)   . Hypernatremia   . Hyperosmolality   . Hypertension   . Vitamin D deficiency     There are no problems to display for this patient.   History reviewed. No pertinent surgical history.   OB History   No obstetric history on file.     No family history on file.  Social History   Tobacco Use  . Smoking status: Never Smoker  . Smokeless tobacco: Never Used  Substance Use Topics  . Alcohol use: No  . Drug use: No    Home Medications Prior to Admission medications   Medication Sig Start Date End Date Taking? Authorizing Provider  acetaminophen (TYLENOL) 500 MG tablet Take 500 mg by mouth every 4 (four) hours as needed for mild pain, fever or headache.     [provider]  alum & mag hydroxide-simeth (MINTOX) 200-200-20 MG/5ML suspension Take 30 mLs by mouth as needed for indigestion or heartburn.     [provider]  aspirin 81 MG chewable tablet Chew 81 mg by mouth every morning.    [provider]  Calcium Carb-Cholecalciferol (CALCIUM 500 +D) 500-400 MG-UNIT TABS Take 1 tablet by mouth daily with breakfast.     [provider]  ferrous sulfate 220 (44 Fe) MG/5ML solution Take 330 mg by mouth at bedtime.    [provider]  guaifenesin (ROBITUSSIN) 100 MG/5ML syrup Take 200 mg by mouth every 6 (six) hours as needed for cough.    [provider]  hydrALAZINE (APRESOLINE) 50 MG tablet Take 50 mg by mouth 3 (three) times daily.    [provider]  levETIRAcetam (KEPPRA) 250 MG tablet Take 1 tablet (250 mg total) by mouth 2 (two) times daily. 01/27/19 02/26/19  Melene PlanFloyd, Ellenora Talton, DO  loperamide (IMODIUM) 2 MG capsule Take 2 mg by mouth as needed for diarrhea or loose stools.     [provider]  losartan (COZAAR) 25 MG tablet Take 25 mg by mouth every morning.    [provider]  magnesium hydroxide (MILK OF MAGNESIA) 400 MG/5ML suspension Take 30 mLs by mouth at bedtime as needed for mild constipation.  [provider]  neomycin-bacitracin-polymyxin (NEOSPORIN) ointment Apply 1 application topically as needed for wound care.     [provider]  Nutritional Supplements (NUTRITIONAL DRINK) LIQD Take 1 Can by mouth 3 (three) times daily. *Mighty Shakes*    [provider]    Allergies    Patient has no known allergies.  Review of Systems   Review of Systems  Unable to perform ROS: Patient nonverbal  Constitutional: Negative for chills and fever.  HENT: Negative for congestion and rhinorrhea.   Eyes: Negative for redness and visual disturbance.  Respiratory: Negative for shortness of breath and wheezing.   Cardiovascular: Negative for chest pain and  palpitations.  Gastrointestinal: Negative for nausea and vomiting.  Genitourinary: Negative for dysuria and urgency.  Musculoskeletal: Negative for arthralgias and myalgias.  Skin: Negative for pallor and wound.  Neurological: Positive for seizures. Negative for dizziness and headaches.    Physical Exam Updated Vital Signs BP 111/63   Pulse 75   Temp 98.1 F (36.7 C) (Oral)   Resp 16   SpO2 96%   Physical Exam Vitals and nursing note reviewed.  Constitutional:      General: She is not in acute distress.    Appearance: She is well-developed. She is not diaphoretic.     Comments: Chronically ill-appearing, diffusely contractured  HENT:     Head: Normocephalic.     Comments: Superficial laceration just left of the nose.  Old appearing hematoma to the left brow Eyes:     Pupils: Pupils are equal, round, and reactive to light.  Cardiovascular:     Rate and Rhythm: Normal rate and regular rhythm.     Heart sounds: No murmur. No friction rub. No gallop.   Pulmonary:     Effort: Pulmonary effort is normal.     Breath sounds: No wheezing or rales.  Abdominal:     General: There is no distension.     Palpations: Abdomen is soft.     Tenderness: There is no abdominal tenderness.  Musculoskeletal:        General: No tenderness.     Cervical back: Normal range of motion and neck supple.  Skin:    General: Skin is warm and dry.  Neurological:     Mental Status: She is alert and oriented to person, place, and time.  Psychiatric:        Behavior: Behavior normal.     ED Results / Procedures / Treatments   Labs (all labs ordered are listed, but only abnormal results are displayed) Labs Reviewed  CBC WITH DIFFERENTIAL/PLATELET - Abnormal; Notable for the following components:      Result Value   RBC 3.38 (*)    Hemoglobin 9.0 (*)    HCT 30.3 (*)    MCHC 29.7 (*)    RDW 15.9 (*)    Neutro Abs 9.0 (*)    Lymphs Abs 0.6 (*)    All other components within normal limits    COMPREHENSIVE METABOLIC PANEL - Abnormal; Notable for the following components:   CO2 19 (*)    Glucose, Bld 145 (*)    BUN 27 (*)    Creatinine, Ser 1.52 (*)    Albumin 3.4 (*)    AST 313 (*)    ALT 321 (*)    Alkaline Phosphatase 137 (*)    GFR calc non Af Amer 31 (*)    GFR calc Af Amer 36 (*)    All other components within normal limits  CBG MONITORING, ED - Abnormal; Notable for the following components:   Glucose-Capillary 132 (*)    All other components within normal limits    EKG EKG Interpretation  Date/Time:  Wednesday January 27 2019 08:45:36 EST Ventricular Rate:  106 PR Interval:    QRS Duration: 83 QT Interval:  351 QTC Calculation: 467 R Axis:   54 Text Interpretation: Sinus tachycardia Probable left atrial enlargement Borderline T wave abnormalities Baseline wander TECHNICALLY DIFFICULT Otherwise no significant change Confirmed by Melene Plan [phone]) on 01/27/2019 9:29:58 AM   Radiology CT Head Wo Contrast  Result Date: 01/27/2019 CLINICAL DATA:  Seizures. History of encephalopathy. EXAM: CT HEAD WITHOUT CONTRAST TECHNIQUE: Contiguous axial images were obtained from the base of the skull through the vertex without intravenous contrast. COMPARISON:  Head CT dated 12/10/2018. FINDINGS: Brain: Stable marked ventriculomegaly. Chronic small vessel ischemic changes again noted throughout the bilateral periventricular and subcortical white matter regions. No mass, hemorrhage, edema or other evidence of acute parenchymal abnormality. No extra-axial hemorrhage. Vascular: Chronic calcified atherosclerotic changes of the large vessels at the skull base. No unexpected hyperdense vessel. Skull: Normal. Negative for fracture or focal lesion. Sinuses/Orbits: No acute finding. Other: None. IMPRESSION: 1. No acute findings. No intracranial mass, hemorrhage or edema. 2. Stable marked ventriculomegaly. 3. Chronic ischemic changes, as detailed above. Electronically Signed   By: Bary Richard M.D.   On: 01/27/2019 10:47   CT ABDOMEN PELVIS W CONTRAST  Result Date: 01/27/2019 CLINICAL DATA:  Abdominal pain.  Seizure. EXAM: CT ABDOMEN AND PELVIS WITH CONTRAST TECHNIQUE: Multidetector CT imaging of the abdomen and pelvis was performed using the standard protocol following bolus administration of intravenous contrast. CONTRAST:  80mL OMNIPAQUE IOHEXOL 350 MG/ML SOLN COMPARISON:  None. FINDINGS: Lower chest: Bibasilar subpleural atelectasis and moderate eventration of the left hemidiaphragm. The heart is mildly enlarged. Moderate esophageal wall thickening possibly due to reflux esophagitis. Hepatobiliary: No focal hepatic lesions are identified. Gallbladder is grossly normal. There is moderate intra and extrahepatic biliary dilatation although the common bile duct appears to taper normally in the pancreatic head to the ampulla. No obvious pancreatic mass or ampullary mass. Possible distal common bile duct stricture. No obvious stones. Pancreas: No mass, inflammation or ductal dilatation. Moderate pancreatic atrophy. Normal caliber main pancreatic duct. Spleen: Normal size.  No focal lesions. Adrenals/Urinary Tract: The adrenal glands and kidneys are unremarkable. The bladder appears normal. Stomach/Bowel: The stomach, duodenum, small bowel and colon are grossly normal. There is a large amount of stool in the rectum consistent with fecal impaction. Severe sigmoid colon diverticulosis is noted. Vascular/Lymphatic: Vascular calcifications but no aneurysm. The major branch vessels are grossly patent. The major venous structures are patent. Reproductive: Likely surgically absent. Other: No pelvic mass or pelvic adenopathy. Musculoskeletal: No significant bony findings. IMPRESSION: 1. Intra and extrahepatic biliary dilatation of uncertain etiology. No obvious pancreatic head mass, ampullary lesion or common bile duct stone. A distal stricture is possible. Recommend correlation with liver function  studies. 2. Large amount of stool throughout the colon and a markedly stool filled distended rectum consistent with constipation and fecal impaction. 3. Distal esophageal wall thickening possibly due to reflux esophagitis. Electronically Signed   By: Rudie Meyer M.D.   On: 01/27/2019 12:24    Procedures Procedures (including critical care time)  Medications Ordered in ED Medications  levETIRAcetam (KEPPRA) IVPB 1000 mg/100 mL premix (has no administration in time range)  iohexol (OMNIPAQUE) 350 MG/ML injection 80 mL (80 mLs Intravenous Contrast Given 01/27/19  1214)    ED Course  I have reviewed the triage vital signs and the nursing notes.  Pertinent labs & imaging results that were available during my care of the patient were reviewed by me and considered in my medical decision making (see chart for details).    MDM Rules/Calculators/A&P                      86 yo F with a chief complaints of possible seizure.  Patient has no history of the same.  Per the nursing facility she had approximately 3 minutes of generalized shaking.  She is back at her baseline.  She has a small injury to the left side of her face, no reported falls but will obtain a CT scan of the head.  Will obtain basic blood work to assess for electrolyte abnormality or hypoglycemia.  EKG for possible syncope.  CT scan of the head is unremarkable.  I discussed the case with Dr. Amada Jupiter, neurology with her history of Alzheimer's he did recommend starting her on Keppra.  With her mild renal dysfunction he wanted her on 250 mg twice a day.  Will refer for outpatient evaluation.   Work-up here with mild LFT elevation.  Has the patient is nonverbal will obtain a CT scan to evaluate for other pathology.  Patient does have some intra and extrahepatic biliary duct dilatation.  There is no appreciable mass.  On reassessment the patient still continues to have no signs of abdominal tenderness on exam.  I did discuss the case  with Dr. Ailene Ards, gastroenterology.  In her circumstance with chronic disabilities he felt this would be best decided with her and her family physician to determine if further work-up was warranted.  1:21 PM:  I have discussed the diagnosis/risks/treatment options with the patient and believe the pt to be eligible for discharge home to follow-up with PCP, neuro. We also discussed returning to the ED immediately if new or worsening sx occur. We discussed the sx which are most concerning (e.g., sudden worsening pain, fever, inability to tolerate by mouth) that necessitate immediate return. Medications administered to the patient during their visit and any new prescriptions provided to the patient are listed below.  Medications given during this visit Medications  levETIRAcetam (KEPPRA) IVPB 1000 mg/100 mL premix (has no administration in time range)  iohexol (OMNIPAQUE) 350 MG/ML injection 80 mL (80 mLs Intravenous Contrast Given 01/27/19 1214)     The patient appears reasonably screen and/or stabilized for discharge and I doubt any other medical condition or other [facility] requiring further screening, evaluation, or treatment in the ED at this time prior to discharge.   Final Clinical Impression(s) / ED Diagnoses Final diagnoses:  Seizure-like activity (HCC)  LFT elevation  Bile duct abnormality    Rx / DC Orders ED Discharge Orders         Ordered    levETIRAcetam (KEPPRA) 250 MG tablet  2 times daily     01/27/19 1318    Ambulatory referral to Neurology    Comments: New onset seizure?   01/27/19 1318           Melene Plan, DO 01/27/19 1321

## 2019-01-27 NOTE — ED Notes (Signed)
Patient transported to CT 

## 2019-01-27 NOTE — Discharge Instructions (Signed)
Please follow-up with your family doctor for the physician that sees you at the nursing home to evaluate you for your seizure-like activity.  I placed a referral into a neurologist in case they would like you to follow-up with them in the office.  You also incidentally were found to have elevated liver function test.  I did obtain a CT scan of the abdomen pelvis and you did have some signs of bile duct dilatation.  I discussed this with the gastroenterologist here and he suggested you discuss it with your primary care physician or the physician that sees you at the nursing home to decide if in your circumstance further work-up would be warranted.

## 2019-01-27 NOTE — ED Notes (Addendum)
Patient Alert and oriented to baseline. Stable and ambulatory to baseline. Report called to [facility].  Patient belongings were taken by the patient. Transported via stretcher by [employer]

## 2019-04-04 ENCOUNTER — Other Ambulatory Visit: Payer: Self-pay

## 2019-04-04 ENCOUNTER — Emergency Department (HOSPITAL_COMMUNITY)
Admission: EM | Admit: 2019-04-04 | Discharge: 2019-04-04 | Disposition: A | Discharge status: Home / Self Care | Payer: Medicare Other | Attending: Emergency Medicine | Admitting: Emergency Medicine

## 2019-04-04 DIAGNOSIS — S0083XA Contusion of other part of head, initial encounter: Secondary | ICD-10-CM | POA: Diagnosis not present

## 2019-04-04 DIAGNOSIS — W06XXXA Fall from bed, initial encounter: Secondary | ICD-10-CM | POA: Insufficient documentation

## 2019-04-04 DIAGNOSIS — E119 Type 2 diabetes mellitus without complications: Secondary | ICD-10-CM | POA: Diagnosis not present

## 2019-04-04 DIAGNOSIS — I10 Essential (primary) hypertension: Secondary | ICD-10-CM | POA: Diagnosis not present

## 2019-04-04 DIAGNOSIS — G309 Alzheimer's disease, unspecified: Secondary | ICD-10-CM | POA: Insufficient documentation

## 2019-04-04 DIAGNOSIS — Y999 Unspecified external cause status: Secondary | ICD-10-CM | POA: Insufficient documentation

## 2019-04-04 DIAGNOSIS — Y939 Activity, unspecified: Secondary | ICD-10-CM | POA: Diagnosis not present

## 2019-04-04 DIAGNOSIS — Y92122 Bedroom in nursing home as the place of occurrence of the external cause: Secondary | ICD-10-CM | POA: Diagnosis not present

## 2019-04-04 DIAGNOSIS — F0280 Dementia in other diseases classified elsewhere without behavioral disturbance: Secondary | ICD-10-CM | POA: Insufficient documentation

## 2019-04-04 DIAGNOSIS — Z7982 Long term (current) use of aspirin: Secondary | ICD-10-CM | POA: Diagnosis not present

## 2019-04-04 DIAGNOSIS — Z79899 Other long term (current) drug therapy: Secondary | ICD-10-CM | POA: Insufficient documentation

## 2019-04-04 DIAGNOSIS — S0993XA Unspecified injury of face, initial encounter: Secondary | ICD-10-CM | POA: Diagnosis present

## 2019-04-04 NOTE — Discharge Instructions (Addendum)
It was our pleasure to provide your ER care today - we hope that you feel better.  Fall precautions.   Return to ER if worse, new symptoms, change in mental status, vomiting, new or severe pain, or other concern.

## 2019-04-04 NOTE — ED Provider Notes (Signed)
[facility]-EMERGENCY DEPT Provider Note   CSN: [id number] Arrival date & time: 04/04/19  6948     History Chief Complaint  Patient presents with  . Fall    Marilyn Mendez is a 86 y.o. female.  Patient presents via EMS from [facility] w report of fall from bed, approximately 3 feet, acute, episodic, earlier today. Patient has advanced dementia at baseline, and is not verbally conversant at baseline - level 5 caveat. No noted loc post fall, and post fall patients mental status has been reported as c/w baseline. Small contusion noted to forehead, no lacerations. No vomiting. Otherwise, pts mental health and overall condition noted to be at her baseline. No anticoag use. Hx falls.   The history is provided by the patient and the EMS personnel. The history is limited by the condition of the patient.  Fall       Past Medical History:  Diagnosis Date  . Alzheimer's dementia (HCC)   . CVA (cerebral infarction)   . Diabetes mellitus without complication (HCC)   . Hypernatremia   . Hyperosmolality   . Hypertension   . Vitamin D deficiency     There are no problems to display for this patient.   No past surgical history on file.   OB History   No obstetric history on file.     No family history on file.  Social History   Tobacco Use  . Smoking status: Never Smoker  . Smokeless tobacco: Never Used  Substance Use Topics  . Alcohol use: No  . Drug use: No    Home Medications Prior to Admission medications   Medication Sig Start Date End Date Taking? Authorizing Provider  acetaminophen (TYLENOL) 500 MG tablet Take 500 mg by mouth every 4 (four) hours as needed for mild pain, fever or headache.     [provider]  alum & mag hydroxide-simeth (MINTOX) 200-200-20 MG/5ML suspension Take 30 mLs by mouth as needed for indigestion or heartburn.     [provider]  aspirin 81 MG chewable tablet Chew 81 mg by mouth every morning.     [provider]  Calcium Carb-Cholecalciferol (CALCIUM 500 +D) 500-400 MG-UNIT TABS Take 1 tablet by mouth daily with breakfast.     [provider]  ferrous sulfate 220 (44 Fe) MG/5ML solution Take 330 mg by mouth at bedtime.    [provider]  guaifenesin (ROBITUSSIN) 100 MG/5ML syrup Take 200 mg by mouth every 6 (six) hours as needed for cough.    [provider]  hydrALAZINE (APRESOLINE) 50 MG tablet Take 50 mg by mouth 3 (three) times daily.    [provider]  levETIRAcetam (KEPPRA) 250 MG tablet Take 1 tablet (250 mg total) by mouth 2 (two) times daily. 01/27/19 02/26/19  Melene Plan, DO  loperamide (IMODIUM) 2 MG capsule Take 2 mg by mouth as needed for diarrhea or loose stools.     [provider]  losartan (COZAAR) 25 MG tablet Take 25 mg by mouth every morning.    [provider]  magnesium hydroxide (MILK OF MAGNESIA) 400 MG/5ML suspension Take 30 mLs by mouth at bedtime as needed for mild constipation.     [provider]  neomycin-bacitracin-polymyxin (NEOSPORIN) ointment Apply 1 application topically as needed for wound care.     [provider]  Nutritional Supplements (NUTRITIONAL DRINK) LIQD Take 1 Can by mouth 3 (three) times daily. *Mighty Shakes*    [provider]  Allergies    Patient has no known allergies.  Review of Systems   Review of Systems  Unable to perform ROS: Dementia  level 5 caveat - not verbally responsive    Physical Exam Updated Vital Signs BP 140/69   Pulse 62   Temp 97.6 F (36.4 C) (Oral)   Resp 16   SpO2 94%   Physical Exam Vitals and nursing note reviewed.  Constitutional:      Appearance: Normal appearance. She is well-developed.  HENT:     Head:     Comments: Small contusion left forehead.     Right Ear: Tympanic membrane normal.     Left Ear: Tympanic membrane normal.     Nose: Nose normal. No congestion or rhinorrhea.     Comments: No  blood or hematoma.     Mouth/Throat:     Mouth: Mucous membranes are moist.  Eyes:     General: No scleral icterus.    Conjunctiva/sclera: Conjunctivae normal.     Pupils: Pupils are equal, round, and reactive to light.  Neck:     Trachea: No tracheal deviation.  Cardiovascular:     Rate and Rhythm: Normal rate and regular rhythm.     Pulses: Normal pulses.     Heart sounds: Normal heart sounds. No murmur. No friction rub. No gallop.   Pulmonary:     Effort: Pulmonary effort is normal. No respiratory distress.     Breath sounds: Normal breath sounds.  Chest:     Chest wall: No tenderness.  Abdominal:     General: Bowel sounds are normal. There is no distension.     Palpations: Abdomen is soft.     Tenderness: There is no abdominal tenderness. There is no guarding.     Comments: No abd bruising or contusion.   Genitourinary:    Comments: No cva tenderness.  Musculoskeletal:        General: No swelling.     Cervical back: Normal range of motion and neck supple. No rigidity or tenderness. No muscular tenderness.     Comments: CTLS spine, non tender, aligned, no step off. No focal bony tenderness on bilateral extremity exam.   Skin:    General: Skin is warm and dry.     Findings: No rash.  Neurological:     Mental Status: She is alert.     Comments: Alert, and content appearing. Patients mental status described as c/w baseline, dementia, not verbally responsive. Patient does move extremities purposefully.   Psychiatric:     Comments: Content appearing.      ED Results / Procedures / Treatments   Labs (all labs ordered are listed, but only abnormal results are displayed) Labs Reviewed - No data to display  EKG None  Radiology No results found.  Procedures Procedures (including critical care time)  Medications Ordered in ED Medications - No data to display  ED Course  I have reviewed the triage vital signs and the nursing notes.  Pertinent labs & imaging results  that were available during my care of the patient were reviewed by me and considered in my medical decision making (see chart for details).    MDM Rules/Calculators/A&P                      Patient with small contusion to forehead. No LOC noted with fall and no change in mental status post fall. No vomiting. Patient appears in no apparent pain or discomfort. Vitals normal.  Reviewed nursing notes and prior charts for additional history.   Patient currently appears stable for d/c.   Rec fall precautions.  Return precautions also provided.      Final Clinical Impression(s) / ED Diagnoses Final diagnoses:  None    Rx / DC Orders ED Discharge Orders    None       Cathren Laine, MD 04/04/19 [phone]

## 2019-04-04 NOTE — ED Triage Notes (Signed)
[facility] transported pt from [facility] and reports the following:  Pt rolled out of bed. Unwitnessed fall. Bed might have been at 3 feet tall. Raised bump on left side of forehead. Dementia. Non-verbal. No blood thinners.

## 2019-04-04 NOTE — ED Notes (Signed)
Tried calling [place] to report off patient coming back to facility but no answer

## 2019-04-19 ENCOUNTER — Ambulatory Visit: Payer: Medicare Other | Admitting: Neurology

## 2019-05-06 DEATH — deceased
